# Patient Record
Sex: Male | Born: 1960 | Race: White | Hispanic: No | Marital: Married | Smoking: Never smoker
Health system: Southern US, Community
[De-identification: ages and names within clinical notes are randomized; demographics above are authoritative.]

## PROBLEM LIST (undated history)

## (undated) DIAGNOSIS — E785 Hyperlipidemia, unspecified: Secondary | ICD-10-CM

## (undated) DIAGNOSIS — N401 Enlarged prostate with lower urinary tract symptoms: Secondary | ICD-10-CM

## (undated) DIAGNOSIS — R338 Other retention of urine: Secondary | ICD-10-CM

## (undated) DIAGNOSIS — N419 Inflammatory disease of prostate, unspecified: Secondary | ICD-10-CM

## (undated) DIAGNOSIS — N529 Male erectile dysfunction, unspecified: Secondary | ICD-10-CM

## (undated) DIAGNOSIS — Z800 Family history of malignant neoplasm of digestive organs: Secondary | ICD-10-CM

## (undated) HISTORY — DX: Inflammatory disease of prostate, unspecified: N41.9

## (undated) HISTORY — DX: Family history of malignant neoplasm of digestive organs: Z80.0

## (undated) HISTORY — DX: Hyperlipidemia, unspecified: E78.5

## (undated) HISTORY — PX: ANKLE SURGERY: SHX546

## (undated) HISTORY — DX: Other retention of urine: R33.8

## (undated) HISTORY — DX: Benign prostatic hyperplasia with lower urinary tract symptoms: N40.1

## (undated) HISTORY — PX: COLONOSCOPY: SHX174

## (undated) HISTORY — DX: Male erectile dysfunction, unspecified: N52.9

## (undated) HISTORY — PX: POLYPECTOMY: SHX149

---

## 2012-08-24 HISTORY — PX: OTHER SURGICAL HISTORY: SHX169

## 2013-04-18 ENCOUNTER — Encounter: Payer: Self-pay | Admitting: Family Medicine

## 2013-04-18 ENCOUNTER — Ambulatory Visit (INDEPENDENT_AMBULATORY_CARE_PROVIDER_SITE_OTHER): Payer: BC Managed Care – PPO | Admitting: Family Medicine

## 2013-04-18 VITALS — BP 129/82 | HR 70 | Ht 72.0 in | Wt 235.0 lb

## 2013-04-18 DIAGNOSIS — Z800 Family history of malignant neoplasm of digestive organs: Secondary | ICD-10-CM

## 2013-04-18 DIAGNOSIS — Z79899 Other long term (current) drug therapy: Secondary | ICD-10-CM

## 2013-04-18 DIAGNOSIS — R339 Retention of urine, unspecified: Secondary | ICD-10-CM

## 2013-04-18 DIAGNOSIS — Z5181 Encounter for therapeutic drug level monitoring: Secondary | ICD-10-CM

## 2013-04-18 DIAGNOSIS — E785 Hyperlipidemia, unspecified: Secondary | ICD-10-CM

## 2013-04-18 DIAGNOSIS — N529 Male erectile dysfunction, unspecified: Secondary | ICD-10-CM | POA: Insufficient documentation

## 2013-04-18 DIAGNOSIS — Z1211 Encounter for screening for malignant neoplasm of colon: Secondary | ICD-10-CM

## 2013-04-18 DIAGNOSIS — N401 Enlarged prostate with lower urinary tract symptoms: Secondary | ICD-10-CM | POA: Insufficient documentation

## 2013-04-18 DIAGNOSIS — Z23 Encounter for immunization: Secondary | ICD-10-CM

## 2013-04-18 DIAGNOSIS — N138 Other obstructive and reflux uropathy: Secondary | ICD-10-CM

## 2013-04-18 DIAGNOSIS — Z298 Encounter for other specified prophylactic measures: Secondary | ICD-10-CM

## 2013-04-18 HISTORY — DX: Benign prostatic hyperplasia with lower urinary tract symptoms: N40.1

## 2013-04-18 HISTORY — DX: Male erectile dysfunction, unspecified: N52.9

## 2013-04-18 HISTORY — DX: Hyperlipidemia, unspecified: E78.5

## 2013-04-18 HISTORY — DX: Family history of malignant neoplasm of digestive organs: Z80.0

## 2013-04-18 LAB — COMPLETE METABOLIC PANEL WITH GFR
ALT: 32 U/L (ref 0–53)
CO2: 28 mEq/L (ref 19–32)
Calcium: 9.8 mg/dL (ref 8.4–10.5)
Chloride: 104 mEq/L (ref 96–112)
Creat: 0.95 mg/dL (ref 0.50–1.35)
Glucose, Bld: 94 mg/dL (ref 70–99)
Sodium: 141 mEq/L (ref 135–145)
Total Bilirubin: 0.6 mg/dL (ref 0.3–1.2)
Total Protein: 7.2 g/dL (ref 6.0–8.3)

## 2013-04-18 LAB — LIPID PANEL
Cholesterol: 189 mg/dL (ref 0–200)
HDL: 46 mg/dL (ref >39)
LDL Cholesterol: 113 mg/dL — ABNORMAL HIGH (ref 0–99)
Triglycerides: 149 mg/dL (ref <150)

## 2013-04-18 LAB — PSA: PSA: 1.61 ng/mL (ref <=4.00)

## 2013-04-18 MED ORDER — TADALAFIL 2.5 MG PO TABS: ORAL_TABLET | ORAL | Status: DC

## 2013-04-18 NOTE — Progress Notes (Signed)
CC: Raymond Craig is a 52 y.o. male is here for Establish Care   Subjective: HPI:  Former patient of mine at [facility] here to establish care  Followup BPH: Patient is taking 2.5 mg Cialis every 2-3 days he notices no trouble with erectile dysfunction as he was dealing with prior to starting this regimen he also notices that the initial improvement with nighttime urination and weak stream seems to be subsiding. He is waking 2-3 times a night to urinate he is somewhat hesitating to urinate he describes a moderate decrease in the caliber of his urinary stream all of which on a daily basis and have been worsening over the last 2-3 months. Denies dysuria nor testicular pain  Followup hyperlipidemia: Is been a year since his last cholesterol panel was performed he continues on Zocor a daily basis without right upper quadrant pain myalgias nor skin or scleral discoloration he tries to stay active and watch what he eats  Patient requesting counseling on screen for colon cancer and prostate cancer  Patient reports a family history of pancreatic cancer it is occurring his father and in at least 2 of his brothers. Patient has no abdominal complaints nor digestive complaints. Rare alcohol intake no tobacco use. Denies unintentional weight loss or gain nor polyphasia or polydipsia  Review of Systems - General ROS: negative for - chills, fever, night sweats, weight gain or weight loss Ophthalmic ROS: negative for - decreased vision Psychological ROS: negative for - anxiety or depression ENT ROS: negative for - hearing change, nasal congestion, tinnitus or allergies Hematological and Lymphatic ROS: negative for - bleeding problems, bruising or swollen lymph nodes Breast ROS: negative Respiratory ROS: no cough, shortness of breath, or wheezing Cardiovascular ROS: no chest pain or dyspnea on exertion Gastrointestinal ROS: no abdominal pain, change in bowel habits, or black or bloody stools Genito-Urinary  ROS: negative for - genital discharge, genital ulcers, incontinence or abnormal bleeding from genitals Musculoskeletal ROS: negative for - joint pain or muscle pain Neurological ROS: negative for - headaches or memory loss Dermatological ROS: negative for lumps, mole changes, rash and skin lesion changes  Past Medical History  Diagnosis Date  . BPH (benign prostatic hypertrophy) with urinary retention 04/18/2013  . Erectile dysfunction 04/18/2013  . Family history of pancreatic cancer 04/18/2013  . Hyperlipidemia 04/18/2013     Family History  Problem Relation Age of Onset  . Breast cancer Mother   . Pancreatic cancer Father   . Pancreatic cancer Paternal Uncle      History  Substance Use Topics  . Smoking status: Not on file  . Smokeless tobacco: Not on file  . Alcohol Use: Not on file     Objective: Filed Vitals:   04/18/13 0828  BP: 129/82  Pulse: 70    General: Alert and Oriented, No Acute Distress HEENT: Pupils equal, round, reactive to light. Conjunctivae clear.  Moist mucous membranes pharynx unremarkable Lungs: Clear to auscultation bilaterally, no wheezing/ronchi/rales.  Comfortable work of breathing. Good air movement. Cardiac: Regular rate and rhythm. Normal S1/S2.  No murmurs, rubs, nor gallops.  No carotid bruits Abdomen: Normal bowel sounds, soft and non tender without palpable masses. Extremities: No peripheral edema.  Strong peripheral pulses.  Mental Status: No depression, anxiety, nor agitation. Skin: Warm and dry.  Assessment & Plan: Kennet was seen today for establish care.  Diagnoses and associated orders for this visit:  Hyperlipidemia - Lipid panel - COMPLETE METABOLIC PANEL WITH GFR  Encounter for monitoring statin  therapy - Lipid panel - COMPLETE METABOLIC PANEL WITH GFR  Erectile dysfunction  BPH (benign prostatic hypertrophy) with urinary retention - Tadalafil (CIALIS) 2.5 MG TABS; One by mouth daily to treat BPH - PSA  Colon cancer  screening - Ambulatory referral to Gastroenterology  Family history of pancreatic cancer - Ambulatory referral to Genetics  Prophylactic immunotherapy - Flu Vaccine QUAD 36+ mos PF IM (Fluarix)    Hyperlipidemia: He is due for recheck of cholesterol panel, will provide a refill of Crestor based on liver enzymes as well as current LDL cholesterol status BPH: Worsening I've encouraged him to take Cialis 2.5 mg on a daily basis if not improving in 2-3 weeks let me know and we will start doxazosin versus Flomax, we discussed benefits and risks of PSA testing he would prefer PSA testing today Colon cancer screening: He is due for routine screening colonoscopy referral has been placed Strong family history of pancreatic cancer: Joint decision was made to refer to genetic counseling  Return in about 3 months (around 07/19/2013) for BPH F/U.

## 2013-04-19 ENCOUNTER — Telehealth: Payer: Self-pay | Admitting: Family Medicine

## 2013-04-19 ENCOUNTER — Encounter: Payer: Self-pay | Admitting: Gastroenterology

## 2013-04-19 DIAGNOSIS — E785 Hyperlipidemia, unspecified: Secondary | ICD-10-CM

## 2013-04-19 MED ORDER — ROSUVASTATIN CALCIUM 10 MG PO TABS: 10.0000 mg | ORAL_TABLET | Freq: Every day | ORAL | Status: DC

## 2013-04-19 NOTE — Telephone Encounter (Signed)
Sue Lush, Will you please let Mr. Salvia ("coin")  Know that his cholesterol looks much much better compared to that back in May 2013, LDL cholesterol is now well controlled at 113 with a goal of less than 130.  There is no evidence of liver inflammation.  His kidney function and fasting blood sugar look great.  PSA was not elevated which means no elevated risk of prostate cancer.  I'd encourage him to f/u with me in a few weeks if daily cialis does not help with urinary complaints, otherwise continue annual visits.

## 2013-04-19 NOTE — Telephone Encounter (Signed)
Left detailed message on patient vm to call back regarding lab results. Rhonda Cunningham,CMA

## 2013-04-20 NOTE — Addendum Note (Signed)
Addended by: Laren Boom on: 04/20/2013 01:25 PM   Modules accepted: Level of Service

## 2013-04-20 NOTE — Telephone Encounter (Signed)
Left detailed message on the phone with results

## 2013-06-06 ENCOUNTER — Ambulatory Visit (AMBULATORY_SURGERY_CENTER): Payer: Self-pay | Admitting: *Deleted

## 2013-06-06 VITALS — Ht 72.0 in | Wt 240.2 lb

## 2013-06-06 DIAGNOSIS — Z1211 Encounter for screening for malignant neoplasm of colon: Secondary | ICD-10-CM

## 2013-06-06 MED ORDER — MOVIPREP 100 G PO SOLR: ORAL | Status: DC

## 2013-06-06 NOTE — Progress Notes (Signed)
No allergies to eggs or soy. No problems with anesthesia.  

## 2013-06-07 ENCOUNTER — Encounter: Payer: Self-pay | Admitting: Gastroenterology

## 2013-06-20 ENCOUNTER — Ambulatory Visit (AMBULATORY_SURGERY_CENTER): Payer: BC Managed Care – PPO | Admitting: Gastroenterology

## 2013-06-20 ENCOUNTER — Encounter: Payer: Self-pay | Admitting: Gastroenterology

## 2013-06-20 VITALS — BP 127/77 | HR 71 | Temp 97.4°F | Resp 15 | Ht 72.0 in | Wt 240.0 lb

## 2013-06-20 DIAGNOSIS — Z1211 Encounter for screening for malignant neoplasm of colon: Secondary | ICD-10-CM

## 2013-06-20 DIAGNOSIS — D126 Benign neoplasm of colon, unspecified: Secondary | ICD-10-CM

## 2013-06-20 MED ORDER — SODIUM CHLORIDE 0.9 % IV SOLN: 500.0000 mL | INTRAVENOUS | Status: DC

## 2013-06-20 NOTE — Patient Instructions (Signed)
YOU HAD AN ENDOSCOPIC PROCEDURE TODAY AT THE [facility]: Refer to the procedure report that was given to you for any specific questions about what was found during the examination.  If the procedure report does not answer your questions, please call your gastroenterologist to clarify.  If you requested that your care partner not be given the details of your procedure findings, then the procedure report has been included in a sealed envelope for you to review at your convenience later.  YOU SHOULD EXPECT: Some feelings of bloating in the abdomen. Passage of more gas than usual.  Walking can help get rid of the air that was put into your GI tract during the procedure and reduce the bloating. If you had a lower endoscopy (such as a colonoscopy or flexible sigmoidoscopy) you may notice spotting of blood in your stool or on the toilet paper. If you underwent a bowel prep for your procedure, then you may not have a normal bowel movement for a few days.  DIET: Your first meal following the procedure should be a light meal and then it is ok to progress to your normal diet.  A half-sandwich or bowl of soup is an example of a good first meal.  Heavy or fried foods are harder to digest and may make you feel nauseous or bloated.  Likewise meals heavy in dairy and vegetables can cause extra gas to form and this can also increase the bloating.  Drink plenty of fluids but you should avoid alcoholic beverages for 24 hours.  ACTIVITY: Your care partner should take you home directly after the procedure.  You should plan to take it easy, moving slowly for the rest of the day.  You can resume normal activity the day after the procedure however you should NOT DRIVE or use heavy machinery for 24 hours (because of the sedation medicines used during the test).    SYMPTOMS TO REPORT IMMEDIATELY: A gastroenterologist can be reached at any hour.  During normal business hours, 8:30 AM to 5:00 PM Monday through Friday,  call [phone].  After hours and on weekends, please call the GI answering service at [phone] who will take a message and have the physician on call contact you.   Following lower endoscopy (colonoscopy or flexible sigmoidoscopy):  Excessive amounts of blood in the stool  Significant tenderness or worsening of abdominal pains  Swelling of the abdomen that is new, acute  Fever of 100F or higher  FOLLOW UP: If any biopsies were taken you will be contacted by phone or by letter within the next 1-3 weeks.  Call your gastroenterologist if you have not heard about the biopsies in 3 weeks.  Our staff will call the home number listed on your records the next business day following your procedure to check on you and address any questions or concerns that you may have at that time regarding the information given to you following your procedure. This is a courtesy call and so if there is no answer at the home number and we have not heard from you through the emergency physician on call, we will assume that you have returned to your regular daily activities without incident.  SIGNATURES/CONFIDENTIALITY: You and/or your care partner have signed paperwork which will be entered into your electronic medical record.  These signatures attest to the fact that that the information above on your After Visit Summary has been reviewed and is understood.  Full responsibility of the confidentiality of this   discharge information lies with you and/or your care-partner.  Polyps-handout given  Repeat colonoscopy will be determined by pathology   

## 2013-06-20 NOTE — Progress Notes (Signed)
Patient did not experience any of the following events: a burn prior to discharge; a fall within the facility; wrong site/side/patient/procedure/implant event; or a hospital transfer or hospital admission upon discharge from the facility. (G8907) Patient did not have preoperative order for IV antibiotic SSI prophylaxis. (G8918)  

## 2013-06-20 NOTE — Op Note (Signed)
[facility] [address] [place], [address]   COLONOSCOPY PROCEDURE REPORT  PATIENT: Raymond, Craig  MR#: [id number] BIRTHDATE: 1960/12/26 , 52  yrs. old GENDER: Male ENDOSCOPIST: Rachael Fee, MD REFERRED BY: Laren Boom, MD PROCEDURE DATE:  06/20/2013 PROCEDURE:   Colonoscopy with snare polypectomy First Screening Colonoscopy - Avg.  risk and is 50 yrs.  old or older Yes.  Prior Negative Screening - Now for repeat screening. N/A  History of Adenoma - Now for follow-up colonoscopy & has been > or = to 3 yrs.  N/A  Polyps Removed Today? Yes. ASA CLASS:   Class II INDICATIONS:average risk screening. MEDICATIONS: Fentanyl 75 mcg IV, Versed 6 mg IV, and These medications were titrated to patient response per physician's verbal order  DESCRIPTION OF PROCEDURE:   After the risks benefits and alternatives of the procedure were thoroughly explained, informed consent was obtained.  A digital rectal exam revealed no abnormalities of the rectum.   The LB [id number]  endoscope was introduced through the anus and advanced to the cecum, which was identified by both the appendix and ileocecal valve. No adverse events experienced.   The quality of the prep was excellent.  The instrument was then slowly withdrawn as the colon was fully examined.  COLON FINDINGS: Three polyps were found, removed and sent to pathology.  These were all sessile, located in ascending and descending segments, 3mm to 8mm across, all removed with cold snare.  The examination was otherwise normal.  Retroflexed views revealed no abnormalities. The time to cecum=2 minutes 09 seconds. Withdrawal time=11 minutes 36 seconds.  The scope was withdrawn and the procedure completed. COMPLICATIONS: There were no complications.  ENDOSCOPIC IMPRESSION: Three polyps were found, removed and sent to pathology. The examination was otherwise normal.  RECOMMENDATIONS: If the polyp(s) removed today are  proven to be adenomatous (pre-cancerous) polyps, you will need a repeat colonoscopy in 3-5 years.  Otherwise you should continue to follow colorectal cancer screening guidelines for "routine risk" patients with colonoscopy in 10 years.  You will receive a letter within 1-2 weeks with the results of your biopsy as well as final recommendations.  Please call my office if you have not received a letter after 3 weeks.   eSigned:  Rachael Fee, MD 06/20/2013 11:18 AM

## 2013-06-21 ENCOUNTER — Telehealth: Payer: Self-pay | Admitting: *Deleted

## 2013-06-21 NOTE — Telephone Encounter (Signed)
  Follow up Call-  Call back number 06/20/2013  Post procedure Call Back phone  # [phone]  Permission to leave phone message Yes     Patient questions:  Do you have a fever, pain , or abdominal swelling? no Pain Score  0 *  Have you tolerated food without any problems? yes  Have you been able to return to your normal activities? yes  Do you have any questions about your discharge instructions: Diet   no Medications  no Follow up visit  no  Do you have questions or concerns about your Care? no  Actions: * If pain score is 4 or above: No action needed, pain <4.  Staff was terrific

## 2013-06-28 ENCOUNTER — Encounter: Payer: Self-pay | Admitting: Gastroenterology

## 2013-06-30 ENCOUNTER — Encounter: Payer: Self-pay | Admitting: Family Medicine

## 2013-06-30 DIAGNOSIS — Z86010 Personal history of colonic polyps: Secondary | ICD-10-CM | POA: Insufficient documentation

## 2013-12-13 ENCOUNTER — Telehealth: Payer: Self-pay | Admitting: Family Medicine

## 2013-12-13 DIAGNOSIS — R338 Other retention of urine: Principal | ICD-10-CM

## 2013-12-13 DIAGNOSIS — N401 Enlarged prostate with lower urinary tract symptoms: Secondary | ICD-10-CM

## 2013-12-13 NOTE — Telephone Encounter (Signed)
Pt called and would like referral to a Urologist in [facility].

## 2013-12-13 NOTE — Telephone Encounter (Signed)
Raymond Craig, Referral has been placed.

## 2014-01-25 ENCOUNTER — Encounter: Payer: Self-pay | Admitting: Family Medicine

## 2014-02-21 ENCOUNTER — Other Ambulatory Visit: Payer: Self-pay | Admitting: Family Medicine

## 2014-02-26 ENCOUNTER — Encounter: Payer: Self-pay | Admitting: Family Medicine

## 2014-02-26 DIAGNOSIS — N200 Calculus of kidney: Secondary | ICD-10-CM | POA: Insufficient documentation

## 2014-04-25 ENCOUNTER — Other Ambulatory Visit: Payer: Self-pay | Admitting: Family Medicine

## 2014-07-13 ENCOUNTER — Other Ambulatory Visit: Payer: Self-pay | Admitting: Family Medicine

## 2014-11-04 ENCOUNTER — Other Ambulatory Visit: Payer: Self-pay | Admitting: Family Medicine

## 2014-11-14 ENCOUNTER — Encounter: Payer: Self-pay | Admitting: Family Medicine

## 2014-11-14 ENCOUNTER — Ambulatory Visit (INDEPENDENT_AMBULATORY_CARE_PROVIDER_SITE_OTHER): Payer: BC Managed Care – PPO | Admitting: Family Medicine

## 2014-11-14 VITALS — BP 131/90 | HR 73 | Wt 242.0 lb

## 2014-11-14 DIAGNOSIS — Z0000 Encounter for general adult medical examination without abnormal findings: Secondary | ICD-10-CM

## 2014-11-14 DIAGNOSIS — G4700 Insomnia, unspecified: Secondary | ICD-10-CM | POA: Diagnosis not present

## 2014-11-14 DIAGNOSIS — N400 Enlarged prostate without lower urinary tract symptoms: Secondary | ICD-10-CM | POA: Diagnosis not present

## 2014-11-14 MED ORDER — ZOLPIDEM TARTRATE ER 6.25 MG PO TBCR: 6.2500 mg | EXTENDED_RELEASE_TABLET | Freq: Every evening | ORAL | Status: DC | PRN

## 2014-11-14 MED ORDER — TADALAFIL 2.5 MG PO TABS: 1.0000 | ORAL_TABLET | Freq: Every day | ORAL | Status: DC

## 2014-11-14 MED ORDER — TETANUS-DIPHTH-ACELL PERTUSSIS 5-2.5-18.5 LF-MCG/0.5 IM SUSP
0.5000 mL | Freq: Once | INTRAMUSCULAR | Status: AC
  Administered 2014-11-14: 0.5 mL via INTRAMUSCULAR

## 2014-11-14 NOTE — Patient Instructions (Signed)
Dr. Sharaine Delange's General Advice Following Your Complete Physical Exam  The Benefits of Regular Exercise: Unless you suffer from an uncontrolled cardiovascular condition, studies strongly suggest that regular exercise and physical activity will add to both the quality and length of your life.  The World Health Organization recommends 150 minutes of moderate intensity aerobic activity every week.  This is best split over 3-4 days a week, and can be as simple as a brisk walk for just over 35 minutes "most days of the week".  This type of exercise has been shown to lower LDL-Cholesterol, lower average blood sugars, lower blood pressure, lower cardiovascular disease risk, improve memory, and increase one's overall sense of wellbeing.  The addition of anaerobic (or "strength training") exercises offers additional benefits including but not limited to increased metabolism, prevention of osteoporosis, and improved overall cholesterol levels.  How Can I Strive For A Low-Fat Diet?: Current guidelines recommend that 25-35 percent of your daily energy (food) intake should come from fats.  One might ask how can this be achieved without having to dissect each meal on a daily basis?  Switch to skim or 1% milk instead of whole milk.  Focus on lean meats such as ground turkey, fresh fish, baked chicken, and lean cuts of beef as your source of dietary protein.  Limit saturated fat consumption to less than 10% of your daily caloric intake.  Limit trans fatty acid consumption primarily by limiting synthetic trans fats such as partially hydrogenated oils (Ex: fried fast foods).  Substitute olive or vegetable oil for solid fats where possible.  Moderation of Salt Intake: Provided you don't carry a diagnosis of congestive heart failure nor renal failure, I recommend a daily allowance of no more than 2300 mg of salt (sodium).  Keeping under this daily goal is associated with a decreased risk of cardiovascular events, creeping  above it can lead to elevated blood pressures and increases your risk of cardiovascular events.  Milligrams (mg) of salt is listed on all nutrition labels, and your daily intake can add up faster than you think.  Most canned and frozen dinners can pack in over half your daily salt allowance in one meal.    Lifestyle Health Risks: Certain lifestyle choices carry specific health risks.  As you may already know, tobacco use has been associated with increasing one's risk of cardiovascular disease, pulmonary disease, numerous cancers, among many other issues.  What you may not know is that there are medications and nicotine replacement strategies that can more than double your chances of successfully quitting.  I would be thrilled to help manage your quitting strategy if you currently use tobacco products.  When it comes to alcohol use, I've yet to find an "ideal" daily allowance.  Provided an individual does not have a medical condition that is exacerbated by alcohol consumption, general guidelines determine "safe drinking" as no more than two standard drinks for a man or no more than one standard drink for a male per day.  However, much debate still exists on whether any amount of alcohol consumption is technically "safe".  My general advice, keep alcohol consumption to a minimum for general health promotion.  If you or others believe that alcohol, tobacco, or recreational drug use is interfering with your life, I would be happy to provide confidential counseling regarding treatment options.  General "Over The Counter" Nutrition Advice: Postmenopausal women should aim for a daily calcium intake of 1200 mg, however a significant portion of this might already be   provided by diets including milk, yogurt, cheese, and other dairy products.  Vitamin D has been shown to help preserve bone density, prevent fatigue, and has even been shown to help reduce falls in the elderly.  Ensuring a daily intake of 800 Units of  Vitamin D is a good place to start to enjoy the above benefits, we can easily check your Vitamin D level to see if you'd potentially benefit from supplementation beyond 800 Units a day.  Folic Acid intake should be of particular concern to women of childbearing age.  Daily consumption of 400-800 mcg of Folic Acid is recommended to minimize the chance of spinal cord defects in a fetus should pregnancy occur.    For many adults, accidents still remain one of the most common culprits when it comes to cause of death.  Some of the simplest but most effective preventitive habits you can adopt include regular seatbelt use, proper helmet use, securing firearms, and regularly testing your smoke and carbon monoxide detectors.  Altariq Goodall B. Deshara Rossi DO [facility] [address] [place], [place] [address] Phone: [phone]  

## 2014-11-14 NOTE — Progress Notes (Signed)
CC: Raymond Craig is a 54 y.o. male is here for Annual Exam   Subjective: HPI:  Colonoscopy: October 2014 with polyps, repeat 2017 Prostate: Discussed screening risks/beneifts with patient today, he believes that his PSA was checked late summer 2016 by his urologist and was normal he declines PSA today  Influenza Vaccine: declined Pneumovax: no indication Td/Tdap: overdue and will receive booster today Zoster: (Start 52 yo)  His only acute complaint today is difficulty staying asleep. This happens most days of the week. He is not sure exactly what contributes to it. He was once on a prescription sleep medicine in the past but he forgets the name of it. He denies any intolerance to sleep medications in the past that over-the-counter Benadryl and other sleep aids do not seem effective. He usually gets 4 hours of sleep and will wake up for no particular reason and although he still tired cannot fall back asleep. Denies anxiety or pain or urinary frequency contributing to this issue.  Review of Systems - General ROS: negative for - chills, fever, night sweats, weight gain or weight loss Ophthalmic ROS: negative for - decreased vision Psychological ROS: negative for - anxiety or depression ENT ROS: negative for - hearing change, nasal congestion, tinnitus or allergies Hematological and Lymphatic ROS: negative for - bleeding problems, bruising or swollen lymph nodes Breast ROS: negative Respiratory ROS: no cough, shortness of breath, or wheezing Cardiovascular ROS: no chest pain or dyspnea on exertion Gastrointestinal ROS: no abdominal pain, change in bowel habits, or black or bloody stools Genito-Urinary ROS: negative for - genital discharge, genital ulcers, incontinence or abnormal bleeding from genitals Musculoskeletal ROS: negative for - joint pain or muscle pain Neurological ROS: negative for - headaches or memory loss Dermatological ROS: negative for lumps, mole changes, rash and skin lesion  changes  Past Medical History  Diagnosis Date  . BPH (benign prostatic hypertrophy) with urinary retention 04/18/2013  . Erectile dysfunction 04/18/2013  . Family history of pancreatic cancer 04/18/2013  . Hyperlipidemia 04/18/2013    Past Surgical History  Procedure Laterality Date  . Lipoma removal  2014    left chest   Family History  Problem Relation Age of Onset  . Breast cancer Mother   . Pancreatic cancer Father   . Pancreatic cancer Paternal Uncle   . Colon cancer Neg Hx     History   Social History  . Marital Status: Married    Spouse Name: N/A  . Number of Children: N/A  . Years of Education: N/A   Occupational History  . Not on file.   Social History Main Topics  . Smoking status: Never Smoker   . Smokeless tobacco: Never Used  . Alcohol Use: 2.4 oz/week    4 Cans of beer per week  . Drug Use: No  . Sexual Activity: Not on file   Other Topics Concern  . Not on file   Social History Narrative     Objective: BP 131/90 mmHg  Pulse 73  Wt 242 lb (109.77 kg)  SpO2 96%  General: No Acute Distress HEENT: Atraumatic, normocephalic, conjunctivae normal without scleral icterus.  No nasal discharge, hearing grossly intact, TMs with good landmarks bilaterally with no middle ear abnormalities, posterior pharynx clear without oral lesions. Neck: Supple, trachea midline, no cervical nor supraclavicular adenopathy. Pulmonary: Clear to auscultation bilaterally without wheezing, rhonchi, nor rales. Cardiac: Regular rate and rhythm.  No murmurs, rubs, nor gallops. No peripheral edema.  2+ peripheral pulses bilaterally. Abdomen: Bowel  sounds normal.  No masses.  Non-tender without rebound.  Negative Murphy's sign. MSK: Grossly intact, no signs of weakness.  Full strength throughout upper and lower extremities.  Full ROM in upper and lower extremities.  No midline spinal tenderness. Neuro: Gait unremarkable, CN II-XII grossly intact.  C5-C6 Reflex 2/4 Bilaterally, L4  Reflex 2/4 Bilaterally.  Cerebellar function intact. Skin: No rashes. Psych: Alert and oriented to person/place/time.  Thought process normal. No anxiety/depression.  Assessment & Plan: Raymond Craig was seen today for annual exam.  Diagnoses and all orders for this visit:  Annual physical exam Orders: -     Lipid panel -     COMPLETE METABOLIC PANEL WITH GFR -     CBC  Insomnia Orders: -     zolpidem (AMBIEN CR) 6.25 MG CR tablet; Take 1 tablet (6.25 mg total) by mouth at bedtime as needed for sleep.  BPH (benign prostatic hyperplasia) Orders: -     Tadalafil (CIALIS) 2.5 MG TABS; Take 1 tablet (2.5 mg total) by mouth daily.  Healthy lifestyle interventions including but not limited to regular exercise, a healthy low fat diet, moderation of salt intake, the dangers of tobacco/alcohol/recreational drug use, nutrition supplementation, and accident avoidance were discussed with the patient and a handout was provided for future reference.  Requesting refills on Cialis   discussed risks and benefits of using AmbienAnd if ineffective can increase to the 12 mg formulation  Return for Annual follow up if labs are normal today.Marland Kitchen

## 2014-11-15 ENCOUNTER — Telehealth: Payer: Self-pay | Admitting: Family Medicine

## 2014-11-15 NOTE — Telephone Encounter (Signed)
Received fax from pharmacy to do pa on Zolpidem tart ER 6.25 mg sent through cover my meds waiting on auth. - CF

## 2014-11-20 ENCOUNTER — Telehealth: Payer: Self-pay | Admitting: *Deleted

## 2014-11-20 MED ORDER — KETOCONAZOLE 2 % EX CREA: TOPICAL_CREAM | CUTANEOUS | Status: DC

## 2014-11-20 NOTE — Telephone Encounter (Signed)
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 He states he was told to use moisturizer but it seems worse and wants a medication called in.Marland KitchenMarland KitchenI do not see anywhere in his chart where he saw you for this problem. He needs appt unless you are aware of this problem

## 2014-11-20 NOTE — Telephone Encounter (Signed)
We talked about seborrheic dermatitis on his face but this was not documented. I will send a prescription of ketoconazole to his [facility]. If this is not beneficial after 2 weeks there are other options that I can call in.

## 2014-11-20 NOTE — Telephone Encounter (Signed)
Pt.notified

## 2014-11-22 ENCOUNTER — Telehealth: Payer: Self-pay | Admitting: Family Medicine

## 2014-11-22 DIAGNOSIS — R739 Hyperglycemia, unspecified: Secondary | ICD-10-CM | POA: Insufficient documentation

## 2014-11-22 LAB — COMPLETE METABOLIC PANEL WITH GFR
ALK PHOS: 41 U/L (ref 39–117)
ALT: 36 U/L (ref 0–53)
AST: 31 U/L (ref 0–37)
Albumin: 4.3 g/dL (ref 3.5–5.2)
BILIRUBIN TOTAL: 0.6 mg/dL (ref 0.2–1.2)
BUN: 13 mg/dL (ref 6–23)
CO2: 28 mEq/L (ref 19–32)
Calcium: 9.6 mg/dL (ref 8.4–10.5)
Chloride: 104 mEq/L (ref 96–112)
Creat: 0.87 mg/dL (ref 0.50–1.35)
GFR, Est African American: >89 mL/min
Glucose, Bld: 108 mg/dL — ABNORMAL HIGH (ref 70–99)
POTASSIUM: 3.6 meq/L (ref 3.5–5.3)
Sodium: 137 mEq/L (ref 135–145)
Total Protein: 7 g/dL (ref 6.0–8.3)

## 2014-11-22 LAB — LIPID PANEL
CHOL/HDL RATIO: 3.9 ratio
CHOLESTEROL: 160 mg/dL (ref 0–200)
HDL: 41 mg/dL (ref >=40)
LDL Cholesterol: 98 mg/dL (ref 0–99)
Triglycerides: 104 mg/dL (ref <150)
VLDL: 21 mg/dL (ref 0–40)

## 2014-11-22 LAB — CBC
HCT: 47 % (ref 39.0–52.0)
Hemoglobin: 16.3 g/dL (ref 13.0–17.0)
MCH: 29.8 pg (ref 26.0–34.0)
MCHC: 34.7 g/dL (ref 30.0–36.0)
MCV: 85.9 fL (ref 78.0–100.0)
MPV: 9.4 fL (ref 8.6–12.4)
PLATELETS: 171 10*3/uL (ref 150–400)
RBC: 5.47 MIL/uL (ref 4.22–5.81)
RDW: 14.1 % (ref 11.5–15.5)
WBC: 5.1 10*3/uL (ref 4.0–10.5)

## 2014-11-22 NOTE — Telephone Encounter (Signed)
Left Vm for patient concerning lab results & pharmacy note about Ambien ER..Zeda Gangwer

## 2014-11-22 NOTE — Telephone Encounter (Signed)
Received Fax from insurance and they will not cover more than the plan allows for this medication. - CF

## 2014-11-22 NOTE — Telephone Encounter (Signed)
Will you also let him know that [facility] is not covering more than 15 capsules of Ambien ER every 30 days.  If he feels that he needs more than this would he be interested in trying the non ER formulation?

## 2014-11-22 NOTE — Telephone Encounter (Signed)
[place], Will you please let patient know that his cholesterol was well controlled, kidney function and liver function was normal, and blood cell counts were all normal.  His blood sugar was mildly elevated and I'd recommend a A1c be checked to measure his three month average blood sugar.  Lab slip in your inbox if the lab is unable to add this on.

## 2014-11-23 ENCOUNTER — Telehealth: Payer: Self-pay | Admitting: *Deleted

## 2014-11-23 MED ORDER — ZOLPIDEM TARTRATE 10 MG PO TABS: 10.0000 mg | ORAL_TABLET | Freq: Every evening | ORAL | Status: DC | PRN

## 2014-11-23 NOTE — Telephone Encounter (Signed)
Seth Bake, Rx placed in in-box ready for pickup/faxing. Immediate release formulation at a higher dose than the long acting formulation he had been using.

## 2014-11-23 NOTE — Telephone Encounter (Signed)
Pt left a message stating he is ok with trying new rx for ambien but he wanted you to know that the last dose of ambien rx;d did not work for him

## 2014-11-23 NOTE — Telephone Encounter (Signed)
Pt.notified

## 2015-02-28 ENCOUNTER — Other Ambulatory Visit: Payer: Self-pay | Admitting: Family Medicine

## 2015-03-24 ENCOUNTER — Other Ambulatory Visit: Payer: Self-pay | Admitting: Family Medicine

## 2015-07-12 ENCOUNTER — Other Ambulatory Visit: Payer: Self-pay | Admitting: Family Medicine

## 2015-10-18 ENCOUNTER — Other Ambulatory Visit: Payer: Self-pay | Admitting: Family Medicine

## 2015-10-21 NOTE — Telephone Encounter (Signed)
Due for f/u appt in March

## 2015-11-24 ENCOUNTER — Other Ambulatory Visit: Payer: Self-pay | Admitting: Family Medicine

## 2015-11-26 ENCOUNTER — Ambulatory Visit (INDEPENDENT_AMBULATORY_CARE_PROVIDER_SITE_OTHER): Payer: BC Managed Care – PPO | Admitting: Family Medicine

## 2015-11-26 ENCOUNTER — Encounter: Payer: Self-pay | Admitting: Family Medicine

## 2015-11-26 VITALS — BP 161/98 | HR 85 | Wt 250.0 lb

## 2015-11-26 DIAGNOSIS — G4700 Insomnia, unspecified: Secondary | ICD-10-CM

## 2015-11-26 DIAGNOSIS — I10 Essential (primary) hypertension: Secondary | ICD-10-CM

## 2015-11-26 DIAGNOSIS — E785 Hyperlipidemia, unspecified: Secondary | ICD-10-CM

## 2015-11-26 DIAGNOSIS — Z800 Family history of malignant neoplasm of digestive organs: Secondary | ICD-10-CM | POA: Diagnosis not present

## 2015-11-26 MED ORDER — ZOLPIDEM TARTRATE 10 MG PO TABS: 10.0000 mg | ORAL_TABLET | Freq: Every evening | ORAL | Status: DC | PRN

## 2015-11-26 NOTE — Progress Notes (Signed)
CC: Raymond Craig is a 55 y.o. male is here for Medication Refill and Hypertension   Subjective: HPI:  Follow-up insomnia: He is getting 6 hours of sleep a night when he takes Ambien. He is very happy with his response but still would like to have a few more hours of sleep. He tells me his functional morning without any known side effects. If he doesn't take this medication he cannot get any more than 1 hours of sleep a night.  Denies urinary, pain or anxiety habits interfering with sleep.  Follow-up hyperlipidemia: He decided to stop taking Crestor as he is fearful of statins.   His father used to have pancreatic cancer and patient is fearful that this could be hereditary. He denies any epigastric pain or loose stools.   no outside blood pressures reported no chest pain shortness of breath orthopnea nor peripheral edema     Review Of Systems Outlined In HPI  Past Medical History  Diagnosis Date  . BPH (benign prostatic hypertrophy) with urinary retention 04/18/2013  . Erectile dysfunction 04/18/2013  . Family history of pancreatic cancer 04/18/2013  . Hyperlipidemia 04/18/2013    Past Surgical History  Procedure Laterality Date  . Lipoma removal  2014    left chest   Family History  Problem Relation Age of Onset  . Breast cancer Mother   . Pancreatic cancer Father   . Pancreatic cancer Paternal Uncle   . Colon cancer Neg Hx     Social History   Social History  . Marital Status: Married    Spouse Name: N/A  . Number of Children: N/A  . Years of Education: N/A   Occupational History  . Not on file.   Social History Main Topics  . Smoking status: Never Smoker   . Smokeless tobacco: Never Used  . Alcohol Use: 2.4 oz/week    4 Cans of beer per week  . Drug Use: No  . Sexual Activity: Not on file   Other Topics Concern  . Not on file   Social History Narrative     Objective: BP 161/98 mmHg  Pulse 85  Wt 250 lb (113.399 kg)  General: Alert and Oriented, No Acute  Distress HEENT: Pupils equal, round, reactive to light. Conjunctivae clear.  Moist mucous membranes  Lungs: Clear to auscultation bilaterally, no wheezing/ronchi/rales.  Comfortable work of breathing. Good air movement. Cardiac: Regular rate and rhythm. Normal S1/S2.  No murmurs, rubs, nor gallops.   Extremities: No peripheral edema.  Strong peripheral pulses.  Mental Status: No depression, anxiety, nor agitation. Skin: Warm and dry.  Assessment & Plan: [place] Craig was seen today for medication refill and hypertension.  Diagnoses and all orders for this visit:  Insomnia  Hyperlipidemia -     Lipid panel  Family history of pancreatic cancer -     Lipase  Essential hypertension  Other orders -     zolpidem (AMBIEN) 10 MG tablet; Take 1 tablet (10 mg total) by mouth at bedtime as needed.  Insomnia: Controlled with Ambien discussed possibly switching to a different agent to see if it gives him more than 6 hours of sleep however joint decision to stick with Ambien since he is not having side effects   hyperlipidemia: Due for repeat lipid panel off of the statin.   family history of pancreatic cancer will screen occasionally with lipase. Discussed DASH diet to help with lowering BP   Return in about 3 months (around 02/25/2016).

## 2016-02-13 ENCOUNTER — Telehealth: Payer: Self-pay

## 2016-02-13 ENCOUNTER — Other Ambulatory Visit: Payer: Self-pay | Admitting: Family Medicine

## 2016-02-13 NOTE — Telephone Encounter (Signed)
Raymond Craig called and states he needs a PA for the Ambien for the day supply. There is a amount limit on the Ambien and he takes it daily.

## 2016-02-14 NOTE — Telephone Encounter (Signed)
Approved for 90 tablets per 75 days; 30 tablets for 25 days ,approved until 02/14/2019, next avail fill date 07/7 will be able to get the 30 then. PA [phone] Left message on patient's vm Message left on pharmacy voicemail

## 2016-03-23 ENCOUNTER — Encounter: Payer: Self-pay | Admitting: Gastroenterology

## 2016-04-15 ENCOUNTER — Telehealth: Payer: Self-pay | Admitting: *Deleted

## 2016-04-15 NOTE — Telephone Encounter (Signed)
Initiated a PA through [employer] (Key: [id number]) [id number]

## 2016-04-16 NOTE — Telephone Encounter (Signed)
The cialis 2.5 mg tablet has been approve. PA # is [phone]. Left discreet message on patients vm and called and let pharn know as well

## 2016-07-13 ENCOUNTER — Other Ambulatory Visit: Payer: Self-pay

## 2016-07-13 MED ORDER — ZOLPIDEM TARTRATE 10 MG PO TABS: 10.0000 mg | ORAL_TABLET | Freq: Every evening | ORAL | 0 refills | Status: DC | PRN

## 2016-08-10 ENCOUNTER — Ambulatory Visit: Payer: BC Managed Care – PPO | Admitting: Osteopathic Medicine

## 2016-08-12 ENCOUNTER — Ambulatory Visit (INDEPENDENT_AMBULATORY_CARE_PROVIDER_SITE_OTHER): Payer: BC Managed Care – PPO | Admitting: Osteopathic Medicine

## 2016-08-12 ENCOUNTER — Encounter: Payer: Self-pay | Admitting: Osteopathic Medicine

## 2016-08-12 VITALS — BP 143/84 | HR 74 | Ht 72.0 in | Wt 245.0 lb

## 2016-08-12 DIAGNOSIS — N529 Male erectile dysfunction, unspecified: Secondary | ICD-10-CM

## 2016-08-12 DIAGNOSIS — N401 Enlarged prostate with lower urinary tract symptoms: Secondary | ICD-10-CM

## 2016-08-12 DIAGNOSIS — R338 Other retention of urine: Secondary | ICD-10-CM

## 2016-08-12 DIAGNOSIS — J069 Acute upper respiratory infection, unspecified: Secondary | ICD-10-CM

## 2016-08-12 DIAGNOSIS — Z86010 Personal history of colonic polyps: Secondary | ICD-10-CM

## 2016-08-12 DIAGNOSIS — G4700 Insomnia, unspecified: Secondary | ICD-10-CM

## 2016-08-12 DIAGNOSIS — B9789 Other viral agents as the cause of diseases classified elsewhere: Secondary | ICD-10-CM

## 2016-08-12 DIAGNOSIS — R030 Elevated blood-pressure reading, without diagnosis of hypertension: Secondary | ICD-10-CM

## 2016-08-12 MED ORDER — TADALAFIL 2.5 MG PO TABS: 2.5000 mg | ORAL_TABLET | Freq: Every day | ORAL | 3 refills | Status: DC

## 2016-08-12 MED ORDER — ZOLPIDEM TARTRATE 10 MG PO TABS: 10.0000 mg | ORAL_TABLET | Freq: Every evening | ORAL | 5 refills | Status: DC | PRN

## 2016-08-12 NOTE — Progress Notes (Signed)
HPI: Raymond Craig is a 55 y.o. male  who presents to [facility] today, 08/12/16,  for chief complaint of:  Chief Complaint  Patient presents with  . Other    SWITCH FROM HOMMEL/ MEDICATION REFILL    Insomnia: Stable. Has been on Ambien for several years, other alternatives tried but don't seem to be helpful. Reports compliance with behavioral modifications  Erectile dysfunction: Stable on Cialis, patient is happy with low-dose daily  History of colon polyps: Patient states that he thinks he is due for a colonoscopy, last one was about 3 years ago. Requests referral. No constipation or blood in the stool.  BP: Mildly elevated, no chest pain, pressure, shortness of breath.  Viral URI: Patient states not bothering him too much today, very mild cough/sinus congestion.   Past medical, surgical, social and family history reviewed: Patient Active Problem List   Diagnosis Date Noted  . Insomnia 11/26/2015  . Hyperglycemia 11/22/2014  . Left nephrolithiasis 02/26/2014  . Personal history of colonic polyps 06/30/2013  . Hyperlipidemia 04/18/2013  . Erectile dysfunction 04/18/2013  . BPH (benign prostatic hypertrophy) with urinary retention 04/18/2013  . Family history of pancreatic cancer 04/18/2013   Past Surgical History:  Procedure Laterality Date  . lipoma removal  2014   left chest   Social History  Substance Use Topics  . Smoking status: Never Smoker  . Smokeless tobacco: Never Used  . Alcohol use 2.4 oz/week    4 Cans of beer per week   Family History  Problem Relation Age of Onset  . Breast cancer Mother   . Pancreatic cancer Father   . Pancreatic cancer Paternal Uncle   . Colon cancer Neg Hx      Current medication list and allergy/intolerance information reviewed:   Current Outpatient Prescriptions on File Prior to Visit  Medication Sig Dispense Refill  . CIALIS 2.5 MG TABS TAKE 1 TABLET (2.5 MG TOTAL) BY MOUTH DAILY. 30 tablet  4  . zolpidem (AMBIEN) 10 MG tablet Take 1 tablet (10 mg total) by mouth at bedtime as needed. 30 tablet 0  . alfuzosin (UROXATRAL) 10 MG 24 hr tablet Take 10 mg by mouth daily with breakfast.     No current facility-administered medications on file prior to visit.    No Known Allergies    Review of Systems:  Constitutional: +recent URI illness no fever/chills  HEENT: No  headache, no vision change  Cardiac: No  chest pain, No  pressure, No palpitations  Respiratory:  No  shortness of breath. +Cough  Gastrointestinal: No  abdominal pain, no change on bowel habits  Musculoskeletal: No new myalgia/arthralgia  Skin: No  Rash  Hem/Onc: No  easy bruising/bleeding, No  abnormal lumps/bumps  Neurologic: No  weakness, No  Dizziness  Psychiatric: No  concerns with depression, No  concerns with anxiety  Exam:  BP (!) 143/84   Pulse 74   Ht 6' (1.829 m)   Wt 245 lb (111.1 kg)   BMI 33.23 kg/m   Constitutional: VS see above. General Appearance: alert, well-developed, well-nourished, NAD  Eyes: Normal lids and conjunctive, non-icteric sclera  Ears, Nose, Mouth, Throat: MMM, Normal external inspection ears/nares/mouth/lips/gums.  Neck: No masses, trachea midline.   Respiratory: Normal respiratory effort. no wheeze, no rhonchi, no rales  Cardiovascular: S1/S2 normal, no murmur, no rub/gallop auscultated. RRR.   Musculoskeletal: Gait normal. Symmetric and independent movement of all extremities  Neurological: Normal balance/coordination. No tremor.  Skin: warm, dry, intact.  Psychiatric: Normal judgment/insight. Normal mood and affect. Oriented x3.     ASSESSMENT/PLAN:   Insomnia, unspecified type - Plan: zolpidem (AMBIEN) 10 MG tablet  History of colonic polyps - Plan: Ambulatory referral to Gastroenterology  Benign prostatic hyperplasia with urinary retention  Erectile dysfunction, unspecified erectile dysfunction type - Plan: Tadalafil (CIALIS) 2.5 MG TABS     Patient Instructions  Note: the following list assumes no pregnancy, normal liver & kidney function and no other drug interactions. Dr. Sheppard Coil has highlighted medications which are safe for you to use, but these may not be appropriate for everyone. Always ask a pharmacist or qualified medical provider if there are any questions!    Aches/Pains, Fever Acetaminophen (Tylenol) 500 mg tablets - take max 2 tablets (1000 mg) every 6 hours (4 times per day)  Ibuprofen (Motrin) 200 mg tablets - take max 4 tablets (800 mg) every 6 hours  Sinus Congestion Prescription Atrovent Cromolyn Nasal Spray (NasalCrom) 1 spray each nostril 3-4 times per day, max 6 imes per day Nasal Saline if desired Oxymetolazone (Afrin, others) sparing use due to rebound congestion Phenylephrine (Sudafed) 10 mg tablets every 4 hours (or the 12-hour formulation) Diphenhydramine (Benadryl) 25 mg tablets - take max 2 tablets every 4 hours  Cough & Sore Throat Prescription cough pills or syrups Dextromethorphan (Robitussin, others) - cough suppressant Guaifenesin (Robitussin, Mucinex, others) - expectorant (helps cough up mucus) (Dextromethorphan and Guaifenesin also come in a combination tablet) Lozenges w/ Benzocaine + Menthol (Cepacol) Honey - as much as you want! Teas which "coat the throat" - look for ingredients Elm Bark, Licorice Root, Marshmallow Root  Other Zinc Lozenges within 24 hours of symptoms onset - mixed evidence this shortens the duration of the common cold Don't waste your money on Vitamin C or Echinacea       Visit summary with medication list and pertinent instructions was printed for patient to review. All questions at time of visit were answered - patient instructed to contact office with any additional concerns. ER/RTC precautions were reviewed with the patient. Follow-up plan: Return in about 6 months (around 02/10/2017) for [facility] .  Note: Total time spent 25 minutes, greater  than 50% of the visit was spent face-to-face counseling and coordinating care for the following: The primary encounter diagnosis was Insomnia, unspecified type. Diagnoses of History of colonic polyps, Benign prostatic hyperplasia with urinary retention, Erectile dysfunction, unspecified erectile dysfunction type, Viral URI, and Elevated blood pressure reading were also pertinent to this visit.Marland Kitchen

## 2016-08-12 NOTE — Patient Instructions (Signed)

## 2016-08-12 NOTE — Progress Notes (Signed)
error 

## 2016-09-07 ENCOUNTER — Telehealth: Payer: Self-pay | Admitting: *Deleted

## 2016-09-07 NOTE — Telephone Encounter (Signed)
Patient's wife called stating the patient has the flu and he is in [place]. She says she wants tamiflu called into a pharmacy there or in route...  Do you usually just print tamiflu upon request?

## 2016-09-07 NOTE — Telephone Encounter (Signed)
Patient's spouse notified

## 2016-09-07 NOTE — Telephone Encounter (Signed)
Patient did not leave pharm info

## 2016-09-07 NOTE — Telephone Encounter (Signed)
Sorry, not without a confirmed positive flu test. If he/family have questions, can let them know I typically reserve tamiflu for confirmed cases of flu in at-risk patients (elderly or infants, heart disease, lung disease, other immune compromise).

## 2016-09-14 ENCOUNTER — Encounter: Payer: Self-pay | Admitting: Osteopathic Medicine

## 2016-09-14 ENCOUNTER — Ambulatory Visit (INDEPENDENT_AMBULATORY_CARE_PROVIDER_SITE_OTHER): Payer: BC Managed Care – PPO | Admitting: Osteopathic Medicine

## 2016-09-14 VITALS — BP 145/82 | HR 64 | Ht 72.0 in | Wt 239.0 lb

## 2016-09-14 DIAGNOSIS — N390 Urinary tract infection, site not specified: Secondary | ICD-10-CM | POA: Diagnosis not present

## 2016-09-14 DIAGNOSIS — R319 Hematuria, unspecified: Secondary | ICD-10-CM | POA: Diagnosis not present

## 2016-09-14 LAB — POCT URINALYSIS DIPSTICK
BILIRUBIN UA: NEGATIVE
Glucose, UA: NEGATIVE
Ketones, UA: NEGATIVE
Leukocytes, UA: NEGATIVE
NITRITE UA: NEGATIVE
PH UA: 5.5
Protein, UA: NEGATIVE
SPEC GRAV UA: 1.025
UROBILINOGEN UA: 0.2

## 2016-09-14 NOTE — Progress Notes (Signed)
HPI: Raymond Craig is a 56 y.o. male  who presents to [facility] today, 09/14/16,  for chief complaint of:  Chief Complaint  Patient presents with  . Urinary Tract Infection    UTI: previous UTI's maybe every few years or so. Recently treated for UTI w/ Cipro 10 days, he is on day 7. Experiencing some GI upset ?d/t antibiotics. No fever, no flank pain, no dysuria.    Past medical, surgical, social and family history reviewed: Patient Active Problem List   Diagnosis Date Noted  . Insomnia 11/26/2015  . Hyperglycemia 11/22/2014  . Left nephrolithiasis 02/26/2014  . History of colonic polyps 06/30/2013  . Hyperlipidemia 04/18/2013  . Erectile dysfunction 04/18/2013  . Benign prostatic hyperplasia with urinary retention 04/18/2013  . Family history of pancreatic cancer 04/18/2013   Past Surgical History:  Procedure Laterality Date  . lipoma removal  2014   left chest   Social History  Substance Use Topics  . Smoking status: Never Smoker  . Smokeless tobacco: Never Used  . Alcohol use 2.4 oz/week    4 Cans of beer per week   Family History  Problem Relation Age of Onset  . Breast cancer Mother   . Pancreatic cancer Father   . Pancreatic cancer Paternal Uncle   . Colon cancer Neg Hx      Current medication list and allergy/intolerance information reviewed:   Current Outpatient Prescriptions on File Prior to Visit  Medication Sig Dispense Refill  . Tadalafil (CIALIS) 2.5 MG TABS Take 1 tablet (2.5 mg total) by mouth daily. 90 tablet 3  . zolpidem (AMBIEN) 10 MG tablet Take 1 tablet (10 mg total) by mouth at bedtime as needed. 30 tablet 5   No current facility-administered medications on file prior to visit.    No Known Allergies    Review of Systems:  Constitutional: +recent illness  HEENT: No  headache, no vision change  Cardiac: No  chest pain  Respiratory:  No  shortness of breath. No  Cough  Gastrointestinal: No  abdominal  pain  Musculoskeletal: No new myalgia/arthralgia  Neurologic: No  weakness, No  Dizziness  Exam:  BP (!) 145/82   Pulse 64   Ht 6' (1.829 m)   Wt 239 lb (108.4 kg)   BMI 32.41 kg/m   Constitutional: VS see above. General Appearance: alert, well-developed, well-nourished, NAD  Eyes: Normal lids and conjunctive, non-icteric sclera  Ears, Nose, Mouth, Throat: MMM, Normal external inspection ears/nares/mouth/lips/gums.  Neck: No masses, trachea midline.   Respiratory: Normal respiratory effort. no wheeze, no rhonchi, no rales  Cardiovascular: S1/S2 normal, no murmur, no rub/gallop auscultated. RRR.   Musculoskeletal: Gait normal. Symmetric and independent movement of all extremities.   Neurological: Normal balance/coordination. No tremor.  Skin: warm, dry, intact.   Psychiatric: Normal judgment/insight. Normal mood and affect. Oriented x3.    Results for orders placed or performed in visit on 09/14/16 (from the past 72 hour(s))  POCT Urinalysis Dipstick     Status: None   Collection Time: 09/14/16  4:25 PM  Result Value Ref Range   Color, UA YELLOW    Clarity, UA CLEAR    Glucose, UA NEGATIVE    Bilirubin, UA NEGATIVE    Ketones, UA NEGATIVE    Spec Grav, UA 1.025    Blood, UA MODERATE    pH, UA 5.5    Protein, UA NEGATIVE    Urobilinogen, UA 0.2    Nitrite, UA NEGATIVE  Leukocytes, UA Negative Negative  Urine Microscopic     Status: None   Collection Time: 09/14/16  4:25 PM  Result Value Ref Range   WBC, UA 0-5 <=5 WBC/HPF   RBC / HPF 0-2 <=2 RBC/HPF   Squamous Epithelial / LPF 0-5 <=5 HPF   Bacteria, UA NONE SEEN NONE SEEN HPF   Crystals NONE SEEN NONE SEEN HPF   Casts NONE SEEN NONE SEEN LPF   Yeast NONE SEEN NONE SEEN HPF    No results found.   ASSESSMENT/PLAN: If was still having dysirua/UTI type symptoms would consider Ct w/wo to eval anatomy and stones, as it stands would defer to urology re: further workup unless worse or change, GI discomfort  likely d/t abx use.   Complicated UTI (urinary tract infection) - re-culture pending.  - Plan: POCT Urinalysis Dipstick, Urine Microscopic, Urine culture, CANCELED: Urine culture  Hematuria, unspecified type - no RBC on micro - would address with urology, culture pending    Patient Instructions  Plan:  Will send urine for labs for further evaluation  I'd schedule a follow-up with your urologist since you've been having multiple UTI. They may discuss further imaging or other tests to evaluate for cause of recurrent UTI. They may also discuss risks versus benefits of just waiting and seeing how you do, but I suspect that with multiple UTI over the past few years they may want to do a workup.   If you have another infection prior to seeing urology, we may need to get a CT scan to look for abnormal urinary anatomy or stones.     Visit summary with medication list and pertinent instructions was printed for patient to review. All questions at time of visit were answered - patient instructed to contact office with any additional concerns. ER/RTC precautions were reviewed with the patient. Follow-up plan: Return if symptoms worsen or fail to improve.

## 2016-09-14 NOTE — Patient Instructions (Signed)
Plan:  Will send urine for labs for further evaluation  I'd schedule a follow-up with your urologist since you've been having multiple UTI. They may discuss further imaging or other tests to evaluate for cause of recurrent UTI. They may also discuss risks versus benefits of just waiting and seeing how you do, but I suspect that with multiple UTI over the past few years they may want to do a workup.   If you have another infection prior to seeing urology, we may need to get a CT scan to look for abnormal urinary anatomy or stones.

## 2016-09-15 DIAGNOSIS — N390 Urinary tract infection, site not specified: Secondary | ICD-10-CM | POA: Insufficient documentation

## 2016-09-15 LAB — URINALYSIS, MICROSCOPIC ONLY
Bacteria, UA: NONE SEEN [HPF]
CRYSTALS: NONE SEEN [HPF]
Casts: NONE SEEN [LPF]
YEAST: NONE SEEN [HPF]

## 2016-09-16 LAB — URINE CULTURE: Organism ID, Bacteria: NO GROWTH

## 2016-09-16 NOTE — Telephone Encounter (Signed)
Patient called and gave information and address for the medical center he was at when sick and out of town. [facility], [place], [place]. Pt adv dr. Needed this information in order to get labs and records. Thanks

## 2016-12-07 ENCOUNTER — Encounter: Payer: Self-pay | Admitting: Gastroenterology

## 2017-01-15 ENCOUNTER — Ambulatory Visit (AMBULATORY_SURGERY_CENTER): Payer: Self-pay | Admitting: *Deleted

## 2017-01-15 ENCOUNTER — Encounter: Payer: Self-pay | Admitting: Gastroenterology

## 2017-01-15 VITALS — Ht 72.0 in | Wt 243.0 lb

## 2017-01-15 DIAGNOSIS — Z86010 Personal history of colonic polyps: Secondary | ICD-10-CM

## 2017-01-15 MED ORDER — NA SULFATE-K SULFATE-MG SULF 17.5-3.13-1.6 GM/177ML PO SOLN: 1.0000 | Freq: Once | ORAL | 0 refills | Status: AC

## 2017-01-15 NOTE — Progress Notes (Signed)
No egg or soy allergy known to patient  No issues with past sedation with any surgeries  or procedures, no intubation problems  No diet pills per patient No home 02 use per patient  No blood thinners per patient  Pt denies issues with constipation  No A fib or A flutter  EMMI video declined    

## 2017-01-26 ENCOUNTER — Ambulatory Visit (AMBULATORY_SURGERY_CENTER): Payer: BC Managed Care – PPO | Admitting: Gastroenterology

## 2017-01-26 ENCOUNTER — Encounter: Payer: Self-pay | Admitting: Gastroenterology

## 2017-01-26 VITALS — BP 124/83 | HR 58 | Temp 98.6°F | Resp 12 | Ht 72.0 in | Wt 243.0 lb

## 2017-01-26 DIAGNOSIS — D126 Benign neoplasm of colon, unspecified: Secondary | ICD-10-CM

## 2017-01-26 DIAGNOSIS — D123 Benign neoplasm of transverse colon: Secondary | ICD-10-CM

## 2017-01-26 DIAGNOSIS — Z860100 Personal history of colon polyps, unspecified: Secondary | ICD-10-CM

## 2017-01-26 DIAGNOSIS — D125 Benign neoplasm of sigmoid colon: Secondary | ICD-10-CM

## 2017-01-26 DIAGNOSIS — D124 Benign neoplasm of descending colon: Secondary | ICD-10-CM

## 2017-01-26 DIAGNOSIS — K635 Polyp of colon: Secondary | ICD-10-CM | POA: Diagnosis not present

## 2017-01-26 MED ORDER — SODIUM CHLORIDE 0.9 % IV SOLN: 500.0000 mL | INTRAVENOUS | Status: DC

## 2017-01-26 NOTE — Op Note (Signed)
[place] Patient Name: Raymond Craig Procedure Date: 01/26/2017 10:09 AM MRN: [id number] Endoscopist: Milus Banister , MD Age: 56 Referring MD:  Date of Birth: 04/01/1961 Gender: Male Account #: [id number] Procedure:                Colonoscopy Indications:              High risk colon cancer surveillance: Personal                            history of colonic polyps: Colonoscopy 2014 three                            subCM polyps (1 adenoma, 2 SSAs) Medicines:                Monitored Anesthesia Care Procedure:                Pre-Anesthesia Assessment:                           - Prior to the procedure, a History and Physical                            was performed, and patient medications and                            allergies were reviewed. The patient's tolerance of                            previous anesthesia was also reviewed. The risks                            and benefits of the procedure and the sedation                            options and risks were discussed with the patient.                            All questions were answered, and informed consent                            was obtained. Prior Anticoagulants: The patient has                            taken no previous anticoagulant or antiplatelet                            agents. ASA Grade Assessment: II - A patient with                            mild systemic disease. After reviewing the risks                            and benefits, the patient was deemed in  satisfactory condition to undergo the procedure.                           After obtaining informed consent, the colonoscope                            was passed under direct vision. Throughout the                            procedure, the patient's blood pressure, pulse, and                            oxygen saturations were monitored continuously. The                            Colonoscope was introduced through  the anus and                            advanced to the the cecum, identified by                            appendiceal orifice and ileocecal valve. The                            colonoscopy was performed without difficulty. The                            patient tolerated the procedure well. The quality                            of the bowel preparation was excellent. The                            ileocecal valve, appendiceal orifice, and rectum                            were photographed. Scope In: 10:14:53 AM Scope Out: 10:28:35 AM Scope Withdrawal Time: 0 hours 10 minutes 55 seconds  Total Procedure Duration: 0 hours 13 minutes 42 seconds  Findings:                 Five sessile polyps were found in the sigmoid                            colon, descending colon and transverse colon. The                            polyps were 2 to 4 mm in size. These polyps were                            removed with a cold snare. Resection and retrieval                            were complete.  The exam was otherwise without abnormality on                            direct and retroflexion views. Complications:            No immediate complications. Estimated blood loss:                            None. Estimated Blood Loss:     Estimated blood loss: none. Impression:               - Five 2 to 4 mm polyps in the sigmoid colon, in                            the descending colon and in the transverse colon,                            removed with a cold snare. Resected and retrieved.                           - The examination was otherwise normal on direct                            and retroflexion views. Recommendation:           - Patient has a contact number available for                            emergencies. The signs and symptoms of potential                            delayed complications were discussed with the                            patient. Return to  normal activities tomorrow.                            Written discharge instructions were provided to the                            patient.                           - Resume previous diet.                           - Continue present medications.                           You will receive a letter within 2-3 weeks with the                            pathology results and my final recommendations.                           If the polyp(s) is proven to be '  pre-cancerous' on                            pathology, you will need repeat colonoscopy in 3-5                            years. Milus Banister, MD 01/26/2017 10:32:23 AM This report has been signed electronically.

## 2017-01-26 NOTE — Progress Notes (Signed)
Called to room to assist during endoscopic procedure.  Patient ID and intended procedure confirmed with present staff. Received instructions for my participation in the procedure from the performing physician.  

## 2017-01-26 NOTE — Progress Notes (Signed)
A and O x3. Report to RN. Tolerated MAC anesthesia well.

## 2017-01-26 NOTE — Progress Notes (Signed)
Pt's states no medical or surgical changes since previsit or office visit. 

## 2017-01-26 NOTE — Patient Instructions (Signed)
  HANDOUT GIVEN: POLYPS.   YOU HAD AN ENDOSCOPIC PROCEDURE TODAY AT [facility]:   Refer to the procedure report that was given to you for any specific questions about what was found during the examination.  If the procedure report does not answer your questions, please call your gastroenterologist to clarify.  If you requested that your care partner not be given the details of your procedure findings, then the procedure report has been included in a sealed envelope for you to review at your convenience later.  YOU SHOULD EXPECT: Some feelings of bloating in the abdomen. Passage of more gas than usual.  Walking can help get rid of the air that was put into your GI tract during the procedure and reduce the bloating. If you had a lower endoscopy (such as a colonoscopy or flexible sigmoidoscopy) you may notice spotting of blood in your stool or on the toilet paper. If you underwent a bowel prep for your procedure, you may not have a normal bowel movement for a few days.  Please Note:  You might notice some irritation and congestion in your nose or some drainage.  This is from the oxygen used during your procedure.  There is no need for concern and it should clear up in a day or so.  SYMPTOMS TO REPORT IMMEDIATELY:   Following lower endoscopy (colonoscopy or flexible sigmoidoscopy):  Excessive amounts of blood in the stool  Significant tenderness or worsening of abdominal pains  Swelling of the abdomen that is new, acute  Fever of 100F or higher   For urgent or emergent issues, a gastroenterologist can be reached at any hour by calling [phone].   DIET:  We do recommend a small meal at first, but then you may proceed to your regular diet.  Drink plenty of fluids but you should avoid alcoholic beverages for 24 hours.  ACTIVITY:  You should plan to take it easy for the rest of today and you should NOT DRIVE or use heavy machinery until tomorrow (because of the sedation  medicines used during the test).    FOLLOW UP: Our staff will call the number listed on your records the next business day following your procedure to check on you and address any questions or concerns that you may have regarding the information given to you following your procedure. If we do not reach you, we will leave a message.  However, if you are feeling well and you are not experiencing any problems, there is no need to return our call.  We will assume that you have returned to your regular daily activities without incident.  If any biopsies were taken you will be contacted by phone or by letter within the next 1-3 weeks.  Please call us at [phone] if you have not heard about the biopsies in 3 weeks.    SIGNATURES/CONFIDENTIALITY: You and/or your care partner have signed paperwork which will be entered into your electronic medical record.  These signatures attest to the fact that that the information above on your After Visit Summary has been reviewed and is understood.  Full responsibility of the confidentiality of this discharge information lies with you and/or your care-partner.

## 2017-01-27 ENCOUNTER — Telehealth: Payer: Self-pay | Admitting: *Deleted

## 2017-01-27 NOTE — Telephone Encounter (Signed)
Message left on f/u call °

## 2017-01-27 NOTE — Telephone Encounter (Signed)
Message left

## 2017-02-02 ENCOUNTER — Encounter: Payer: Self-pay | Admitting: Gastroenterology

## 2017-02-12 ENCOUNTER — Other Ambulatory Visit: Payer: Self-pay | Admitting: Osteopathic Medicine

## 2017-02-12 DIAGNOSIS — G4700 Insomnia, unspecified: Secondary | ICD-10-CM

## 2017-08-19 ENCOUNTER — Other Ambulatory Visit: Payer: Self-pay | Admitting: Osteopathic Medicine

## 2017-08-19 DIAGNOSIS — G4700 Insomnia, unspecified: Secondary | ICD-10-CM

## 2017-08-20 ENCOUNTER — Ambulatory Visit (INDEPENDENT_AMBULATORY_CARE_PROVIDER_SITE_OTHER): Payer: BC Managed Care – PPO | Admitting: Physician Assistant

## 2017-08-20 ENCOUNTER — Encounter: Payer: Self-pay | Admitting: Physician Assistant

## 2017-08-20 VITALS — BP 131/75 | HR 61 | Temp 98.3°F | Resp 16 | Wt 253.3 lb

## 2017-08-20 DIAGNOSIS — G4700 Insomnia, unspecified: Secondary | ICD-10-CM | POA: Diagnosis not present

## 2017-08-20 DIAGNOSIS — K146 Glossodynia: Secondary | ICD-10-CM | POA: Diagnosis not present

## 2017-08-20 DIAGNOSIS — R432 Parageusia: Secondary | ICD-10-CM | POA: Diagnosis not present

## 2017-08-20 MED ORDER — ZOLPIDEM TARTRATE 10 MG PO TABS: 10.0000 mg | ORAL_TABLET | Freq: Every evening | ORAL | 0 refills | Status: DC | PRN

## 2017-08-20 MED ORDER — LIDOCAINE VISCOUS 2 % MT SOLN
10.0000 mL | OROMUCOSAL | 0 refills | Status: DC | PRN
Start: 2017-08-20 — End: 2017-12-29

## 2017-08-20 NOTE — Patient Instructions (Addendum)
-   can try Biotene oral rinse for dry mouth/altered taste - viscous lidocaine- swish and spit every 3 hours as needed for pain - suck on ice chips/popsicles - modify diet - avoid foods that are spicy or require a lot of chewing - eat soft foods - rinse mouth 5-6 times daily - use a soft tooth brush

## 2017-08-20 NOTE — Progress Notes (Signed)
HPI:                                                                Raymond Craig is a 56 y.o. male who presents to [facility] today for tongue pain  Patient reports painful tongue gradually worsening x 2 months. Pain involves the entire surface of the tongue. Describes pain as stinging, persistent, 8/10 in severity. He has tried a tongue guard, which he states made it worse. He also noted altered taste, stating everything now tastes sour approximately 2 weeks ago. Denies fever, malaise, sore throat, dysphagia, oral lesions, bleeding gums, or dental pain. Denies history of using tobacco products.  Also requesting refill of Ambien for insomnia.  Past Medical History:  Diagnosis Date  . BPH (benign prostatic hypertrophy) with urinary retention 04/18/2013  . Erectile dysfunction 04/18/2013  . Family history of pancreatic cancer 04/18/2013  . Hyperlipidemia 04/18/2013  . Prostatitis    Past Surgical History:  Procedure Laterality Date  . COLONOSCOPY    . lipoma removal  2014   left chest   Social History   Tobacco Use  . Smoking status: Never Smoker  . Smokeless tobacco: Never Used  Substance Use Topics  . Alcohol use: Yes    Alcohol/week: 2.4 oz    Types: 4 Cans of beer per week   family history includes Breast cancer in his mother; Pancreatic cancer in his father and paternal uncle.  ROS: negative except as noted in the HPI  Medications: Current Outpatient Medications  Medication Sig Dispense Refill  . Tadalafil (CIALIS) 2.5 MG TABS Take 1 tablet (2.5 mg total) by mouth daily. 90 tablet 3  . zolpidem (AMBIEN) 10 MG tablet TAKE 1 TABLET BY MOUTH AT BEDTIME AS NEEDED 30 tablet 5   Current Facility-Administered Medications  Medication Dose Route Frequency Provider Last Rate Last Dose  . 0.9 %  sodium chloride infusion  500 mL Intravenous Continuous Milus Banister, MD       No Known Allergies     Objective:  BP 131/75 (BP  Location: Right Arm, Patient Position: Sitting, Cuff Size: Large)   Pulse 61   Temp 98.3 F (36.8 C) (Oral)   Resp 16   Wt 253 lb 4.8 oz (114.9 kg)   SpO2 96%   BMI 34.35 kg/m  Physical Exam  Constitutional: Vital signs are normal. He is active. He does not appear ill.  HENT:  Mouth/Throat: Uvula is midline, oropharynx is clear and moist and mucous membranes are normal. No oral lesions. Dental caries (fillings) present. No oropharyngeal exudate. Tonsils are 1+ on the right. Tonsils are 1+ on the left.    Neck: Trachea normal and phonation normal.  Lymphadenopathy:       Head (right side): No submental, no submandibular and no tonsillar adenopathy present.       Head (left side): No submental, no submandibular and no tonsillar adenopathy present.    He has no cervical adenopathy.  Neurological: He is alert.     Depression screen [facility] 2/9 08/20/2017  Decreased Interest 0  Down, Depressed, Hopeless 0  PHQ - 2 Score 0  Altered sleeping 2  Tired, decreased energy 1  Change in appetite 0  Feeling bad or failure  about yourself  0  Trouble concentrating 0  Moving slowly or fidgety/restless 0  Suicidal thoughts 0  PHQ-9 Score 3  Difficult doing work/chores Somewhat difficult     No results found for this or any previous visit (from the past 72 hour(s)). No results found.    Assessment and Plan: 56 y.o. male with   1. Painful tongue - no evidence of infection/stomatitis. Symptomatic management with viscous lidocaine and dietary modification - Ambulatory referral to ENT - lidocaine (XYLOCAINE) 2 % solution; Use as directed 10 mLs in the mouth or throat as needed for mouth pain.  Dispense: 100 mL; Refill: 0  2. Altered taste - trial biotene dry mouth rinse - Ambulatory referral to ENT  3. Insomnia, unspecified type - instructed to follow-up with PCP. Short-supply provided - zolpidem (AMBIEN) 10 MG tablet; Take 1 tablet (10 mg total) by mouth at bedtime as needed.   Dispense: 30 tablet; Refill: 0   Patient education and anticipatory guidance given Patient agrees with treatment plan Follow-up as needed if symptoms worsen or fail to improve  Darlyne Russian PA-C

## 2017-08-23 NOTE — Telephone Encounter (Signed)
ambien refill addressed

## 2017-09-14 ENCOUNTER — Ambulatory Visit: Payer: BC Managed Care – PPO | Admitting: Osteopathic Medicine

## 2017-09-14 ENCOUNTER — Encounter: Payer: Self-pay | Admitting: Osteopathic Medicine

## 2017-09-14 VITALS — BP 112/70 | HR 93 | Temp 98.1°F | Wt 244.0 lb

## 2017-09-14 DIAGNOSIS — Z86010 Personal history of colonic polyps: Secondary | ICD-10-CM | POA: Diagnosis not present

## 2017-09-14 DIAGNOSIS — Z0000 Encounter for general adult medical examination without abnormal findings: Secondary | ICD-10-CM | POA: Diagnosis not present

## 2017-09-14 DIAGNOSIS — G4700 Insomnia, unspecified: Secondary | ICD-10-CM | POA: Diagnosis not present

## 2017-09-14 DIAGNOSIS — Z23 Encounter for immunization: Secondary | ICD-10-CM

## 2017-09-14 MED ORDER — ZOLPIDEM TARTRATE 10 MG PO TABS: 5.0000 mg | ORAL_TABLET | Freq: Every evening | ORAL | 1 refills | Status: DC | PRN

## 2017-09-14 MED ORDER — ZOSTER VAC RECOMB ADJUVANTED 50 MCG/0.5ML IM SUSR
0.5000 mL | Freq: Once | INTRAMUSCULAR | 1 refills | Status: AC
Start: 2017-09-14 — End: 2017-09-14

## 2017-09-14 NOTE — Progress Notes (Signed)
HPI: Raymond Craig is a 57 y.o. male  who presents to [facility] today, 09/14/17,  for chief complaint of: Check-up, refills     Patient here for annual physical / wellness exam.  See preventive care reviewed as below.  Recent labs reviewed in detail with the patient.    Brief review chronic issues:    Insomnia:  Stable. Has been on Ambien for several years, other alternatives tried but don't seem to be helpful. Reports compliance with behavioral modifications  GU:  Stable on Cialis, patient is happy with low-dose daily. Following with Urology for PSA and BPH.   History of colon polyps:  Colonoscopy 01/26/17.Marland Kitchen No constipation or blood in the stool.  Preventive care:  Has been sometime since routine labs. Last cholesterol on file 2016.      Past medical, surgical, social and family history reviewed: Patient Active Problem List   Diagnosis Date Noted  . Complicated UTI (urinary tract infection) 09/15/2016  . Insomnia 11/26/2015  . Hyperglycemia 11/22/2014  . Left nephrolithiasis 02/26/2014  . History of colonic polyps 06/30/2013  . Hyperlipidemia 04/18/2013  . Erectile dysfunction 04/18/2013  . Benign prostatic hyperplasia with urinary retention 04/18/2013  . Family history of pancreatic cancer 04/18/2013   Past Surgical History:  Procedure Laterality Date  . COLONOSCOPY    . lipoma removal  2014   left chest   Social History   Tobacco Use  . Smoking status: Never Smoker  . Smokeless tobacco: Never Used  Substance Use Topics  . Alcohol use: Yes    Alcohol/week: 2.4 oz    Types: 4 Cans of beer per week   Family History  Problem Relation Age of Onset  . Breast cancer Mother   . Pancreatic cancer Father   . Pancreatic cancer Paternal Uncle   . Colon cancer Neg Hx   . Colon polyps Neg Hx   . Esophageal cancer Neg Hx   . Rectal cancer Neg Hx   . Stomach cancer Neg Hx      Current medication list and allergy/intolerance  information reviewed:   Current Outpatient Medications on File Prior to Visit  Medication Sig Dispense Refill  . lidocaine (XYLOCAINE) 2 % solution Use as directed 10 mLs in the mouth or throat as needed for mouth pain. 100 mL 0  . Tadalafil (CIALIS) 2.5 MG TABS Take 1 tablet (2.5 mg total) by mouth daily. 90 tablet 3  . zolpidem (AMBIEN) 10 MG tablet Take 1 tablet (10 mg total) by mouth at bedtime as needed. 30 tablet 0   Current Facility-Administered Medications on File Prior to Visit  Medication Dose Route Frequency Provider Last Rate Last Dose  . 0.9 %  sodium chloride infusion  500 mL Intravenous Continuous Milus Banister, MD       No Known Allergies    Review of Systems:  Constitutional: No fever/chills or recent illness   HEENT: No  headache, no vision change  Cardiac: No  chest pain, No  pressure, No palpitations  Respiratory:  No  shortness of breath. +Cough  Gastrointestinal: No  abdominal pain, no change on bowel habits  Musculoskeletal: No new myalgia/arthralgia  Skin: No  Rash  Hem/Onc: No  easy bruising/bleeding, No  abnormal lumps/bumps  Neurologic: No  weakness, No  Dizziness  Psychiatric: No  concerns with depression, No  concerns with anxiety  Exam:  BP 112/70   Pulse 93   Temp 98.1 F (36.7 C) (Oral)   Wt  244 lb (110.7 kg)   BMI 33.09 kg/m   Constitutional: VS see above. General Appearance: alert, well-developed, well-nourished, NAD  Eyes: Normal lids and conjunctive, non-icteric sclera  Ears, Nose, Mouth, Throat: MMM, Normal external inspection ears/nares/mouth/lips/gums.  Neck: No masses, trachea midline.   Respiratory: Normal respiratory effort. no wheeze, no rhonchi, no rales  Cardiovascular: S1/S2 normal, no murmur, no rub/gallop auscultated. RRR.   Musculoskeletal: Gait normal. Symmetric and independent movement of all extremities  Neurological: Normal balance/coordination. No tremor.  Skin: warm, dry, intact.   Psychiatric:  Normal judgment/insight. Normal mood and affect. Oriented x3.     ASSESSMENT/PLAN:   Annual physical exam - Plan: CBC, COMPLETE METABOLIC PANEL WITH GFR, Lipid panel  Need for influenza vaccination - Plan: Flu Vaccine QUAD 6+ mos PF IM (Fluarix Quad PF)  Insomnia, unspecified type - Plan: zolpidem (AMBIEN) 10 MG tablet  Need for shingles vaccine - Plan: Varicella-zoster vaccine IM (Shingrix)    MALE PREVENTIVE CARE  updated 09/14/17  ANNUAL SCREENING/COUNSELING  Any changes to health in the past year? no  Diet/Exercise - HEALTHY HABITS DISCUSSED TO DECREASE CV RISK Social History   Tobacco Use  Smoking Status Never Smoker  Smokeless Tobacco Never Used   Social History   Substance and Sexual Activity  Alcohol Use Yes  . Alcohol/week: 2.4 oz  . Types: 4 Cans of beer per week   Depression screen [facility] 2/9 08/20/2017  Decreased Interest 0  Down, Depressed, Hopeless 0  PHQ - 2 Score 0  Altered sleeping 2  Tired, decreased energy 1  Change in appetite 0  Feeling bad or failure about yourself  0  Trouble concentrating 0  Moving slowly or fidgety/restless 0  Suicidal thoughts 0  PHQ-9 Score 3  Difficult doing work/chores Somewhat difficult    INFECTIOUS DISEASE SCREENING  HIV - does ont need  GC/CT - does not need  HepC - does not need  TB - does not need  CANCER SCREENING  Lung - USPSTF: 55-80yo w/ 30 py hx unless quit w/in 79yr - does not need  Colon - does not need - following with GI   Prostate - does not need - following with Urology  OTHER DISEASE SCREENING  Lipid - needs  DM2 - needs  AAA - 65-75yo ever smoked: does not need  Osteoporosis - men 55yo+ - does not need   ADULT VACCINATION  Influenza - annual vaccine recommended  Td - booster every 10 years   Zoster - Shingrix recommended 39+ years old  PCV13 - was not indicated  PPSV23 - was not indicated Immunization History  Administered Date(s) Administered  .  Influenza,inj,Quad PF,6+ Mos 04/18/2013, 09/14/2017  . Influenza-Unspecified 05/08/2016  . Tdap 11/14/2014  . Zoster Recombinat (Shingrix) 09/14/2017      Visit summary with medication list and pertinent instructions was printed for patient to review. All questions at time of visit were answered - patient instructed to contact office with any additional concerns. ER/RTC precautions were reviewed with the patient. Follow-up plan: Return in about 1 year (around 09/14/2018) for [place], sooner if needed . OK to refill Ambien in 6 mos (02/2018) or until next year (08/2018)

## 2017-09-14 NOTE — Patient Instructions (Signed)
Live Zoster (Shingles) Vaccine, ZVL: What You Need to Know  1. What is shingles?  Shingles (also called herpes zoster, or just zoster) is a painful skin rash, often with blisters. Shingles is caused by the varicella zoster virus, the same virus that causes chickenpox. After you have chickenpox, the virus stays in your body and can cause shingles later in life.  You can't catch shingles from another person. However, a person who has never had chickenpox (or chickenpox vaccine) could get chickenpox from someone with shingles.  A shingles rash usually appears on one side of the face or body and heals within 2 to 4 weeks. Its main symptom is pain, which can be severe. Other symptoms can include fever, headache, chills, and upset stomach. Very rarely, a shingles infection can lead to pneumonia, hearing problems, blindness, brain inflammation (encephalitis), or death.  For about 1 person in 5, severe pain can continue even long after the rash has cleared up. This long-lasting pain is called post-herpetic neuralgia (PHN).  Shingles is far more common in people 50 years of age and older than in younger people, and the risk increases with age. It is also more common in people whose immune system is weakened because of a disease such as cancer or by drugs such as steroids or chemotherapy.  At least 1 million people a year in the [place] get shingles.  2. Shingles vaccine (live)  A live shingles vaccine was approved by FDA in 2006. In a clinical trial, the vaccine reduced the risk of shingles by about 50% in people 60 and older. It can reduce the likelihood of PHN, and reduce pain in some people who still get shingles after being vaccinated.  The recommended schedule for live shingles vaccine is a single dose for adults 60 years of age and older.  3. Some people should not get this vaccine  Tell your vaccine provider if you:  · Have any severe, life-threatening allergies. A person who has ever had a life-threatening  allergic reaction after a dose of live shingles vaccine, or has a severe allergy to any component of this vaccine, may be advised not to be vaccinated. Ask your health care provider if you want information about vaccine components.  · Are pregnant, or think you might be pregnant. Pregnant women should wait to get live shingles vaccine until they are no longer pregnant. Women should avoid getting pregnant for at least 1 month after getting shingles vaccine.  · Have a weakened immune system due to disease (such as cancer or AIDS) or medical treatments (such as radiation, immunotherapy, high-dose steroids, or chemotherapy).  · Are not feeling well. If you have a mild illness, such as a cold, you can probably get the vaccine today. If you are moderately or severely ill, you should probably wait until you recover. Your doctor can advise you.    4. Risks of a vaccine reaction  With any medicine, including vaccines, there is a chance of reactions.  After live shingles vaccination, a person might experience:  · Redness, soreness, swelling, or itching at the site of the injection  · Headache    These events are usually mild and go away on their own.  Rarely, live shingles vaccine can cause rash or shingles.  Other things that could happen after this vaccine:  · People sometimes faint after medical procedures, including vaccination. Sitting or lying down for about 15 minutes can help prevent fainting and injuries caused by a fall. Tell your   provider if you feel dizzy or have vision changes or ringing in the ears.  · Some people get shoulder pain that can be more severe and longer-lasting than routine soreness that can follow injections. This happens very rarely.  · Any medication can cause a severe allergic reaction. Such reactions to a vaccine are estimated at about 1 in a million doses, and would happen within a few minutes to a few hours after the vaccination.  As with any medicine, there is a very remote chance of a  vaccine causing a serious injury or death.  The safety of vaccines is always being monitored. For more information, visit: www.cdc.gov/vaccinesafety/  5. What if there is a serious problem?  What should I look for?  · Look for anything that concerns you, such as signs of a severe allergic reaction, very high fever, or unusual behavior.  Signs of a severe allergic reaction can include hives, swelling of the face and throat, difficulty breathing, a fast heartbeat, dizziness, and weakness. These would usually start a few minutes to a few hours after the vaccination.  What should I do?  · If you think it is a severe allergic reaction or other emergency that can't wait, call 9-1-1 and get to the nearest hospital. Otherwise, call your health care provider.  · Afterward, the reaction should be reported to the Vaccine Adverse Event Reporting System (VAERS). Your doctor should file this report, or you can do it yourself through the VAERS web site at www.vaers.hhs.gov or by calling 1-800-822-7967.  VAERS does not give medical advice.  6. How can I learn more?  · Ask your healthcare provider. He or she can give you the vaccine package insert or suggest other sources of information.  · Call your local or state health department.  · Contact the Centers for Disease Control and Prevention (CDC):  ? Call 1-800-232-4636(1-800-CDC-INFO) or  ? Visit CDC's website at www.cdc.gov/vaccines  CDC Vaccine Information Statement (VIS) Live Zoster Vaccine (10/05/2016)  This information is not intended to replace advice given to you by your health care provider. Make sure you discuss any questions you have with your health care provider.  Document Released: 06/07/2006 Document Revised: 10/20/2016 Document Reviewed: 10/20/2016  Elsevier Interactive Patient Education © 2018 [employer]

## 2017-09-15 ENCOUNTER — Telehealth: Payer: Self-pay | Admitting: Osteopathic Medicine

## 2017-09-15 NOTE — Telephone Encounter (Addendum)
Called patient - advised of first Shingrix vaccination being give in the wrong location - no revaccination is needed. Patient stated he is doing fine - arm just a little sore but that's all.   Reminded patient to make appointment to have 2nd vaccine in between 2-6 months. Patient stated he's already scheduled  that appointment.

## 2017-09-15 NOTE — Telephone Encounter (Signed)
I received notification that the first shingrix vaccine was administered yesterday in the wrong location. It was given subcutaneously instead of intramuscularly.   This information was reported to [facility]. I was contacted today by Elsie Provider, Dr. Redmond Pulling. She advised the Pt does not have to get revaccinated, they will just be at an increased risk for pain/swelling. Pt will be due for next injection in 2-6 months.

## 2017-10-17 ENCOUNTER — Other Ambulatory Visit: Payer: Self-pay | Admitting: Osteopathic Medicine

## 2017-10-17 DIAGNOSIS — N529 Male erectile dysfunction, unspecified: Secondary | ICD-10-CM

## 2017-11-10 ENCOUNTER — Ambulatory Visit: Payer: BC Managed Care – PPO

## 2017-12-01 ENCOUNTER — Ambulatory Visit (INDEPENDENT_AMBULATORY_CARE_PROVIDER_SITE_OTHER): Payer: BC Managed Care – PPO | Admitting: Sports Medicine

## 2017-12-01 ENCOUNTER — Ambulatory Visit (INDEPENDENT_AMBULATORY_CARE_PROVIDER_SITE_OTHER): Payer: BC Managed Care – PPO

## 2017-12-01 ENCOUNTER — Encounter: Payer: Self-pay | Admitting: Sports Medicine

## 2017-12-01 VITALS — BP 131/78 | HR 64 | Resp 16 | Wt 226.0 lb

## 2017-12-01 DIAGNOSIS — M25571 Pain in right ankle and joints of right foot: Secondary | ICD-10-CM | POA: Diagnosis not present

## 2017-12-01 DIAGNOSIS — Z23 Encounter for immunization: Secondary | ICD-10-CM

## 2017-12-01 DIAGNOSIS — G8929 Other chronic pain: Secondary | ICD-10-CM | POA: Diagnosis not present

## 2017-12-01 MED ORDER — MELOXICAM 15 MG PO TABS
ORAL_TABLET | ORAL | 3 refills | Status: DC
Start: 2017-12-01 — End: 2017-12-29

## 2017-12-01 NOTE — Progress Notes (Signed)
Subjective:    I'm seeing this patient as a consultation for: Dr. Emeterio Reeve  CC: Ankle pain  HPI: This is a pleasant 57 year old male, he has had pain in his right ankle, lateral aspect for about 2 months now, he does not really recall an injury, swelling, moderate, persistent, localized without radiation, no mechanical symptoms.  I reviewed the past medical history, family history, social history, surgical history, and allergies today and no changes were needed.  Please see the problem list section below in epic for further details.  Past Medical History: Past Medical History:  Diagnosis Date  . BPH (benign prostatic hypertrophy) with urinary retention 04/18/2013  . Erectile dysfunction 04/18/2013  . Family history of pancreatic cancer 04/18/2013  . Hyperlipidemia 04/18/2013  . Prostatitis    Past Surgical History: Past Surgical History:  Procedure Laterality Date  . COLONOSCOPY    . lipoma removal  2014   left chest   Social History: Social History   Socioeconomic History  . Marital status: Married    Spouse name: Not on file  . Number of children: Not on file  . Years of education: Not on file  . Highest education level: Not on file  Occupational History  . Not on file  Social Needs  . Financial resource strain: Not on file  . Food insecurity:    Worry: Not on file    Inability: Not on file  . Transportation needs:    Medical: Not on file    Non-medical: Not on file  Tobacco Use  . Smoking status: Never Smoker  . Smokeless tobacco: Never Used  Substance and Sexual Activity  . Alcohol use: Yes    Alcohol/week: 2.4 oz    Types: 4 Cans of beer per week  . Drug use: No  . Sexual activity: Not on file  Lifestyle  . Physical activity:    Days per week: Not on file    Minutes per session: Not on file  . Stress: Not on file  Relationships  . Social connections:    Talks on phone: Not on file    Gets together: Not on file    Attends religious service:  Not on file    Active member of club or organization: Not on file    Attends meetings of clubs or organizations: Not on file    Relationship status: Not on file  Other Topics Concern  . Not on file  Social History Narrative  . Not on file   Family History: Family History  Problem Relation Age of Onset  . Breast cancer Mother   . Pancreatic cancer Father   . Pancreatic cancer Paternal Uncle   . Colon cancer Neg Hx   . Colon polyps Neg Hx   . Esophageal cancer Neg Hx   . Rectal cancer Neg Hx   . Stomach cancer Neg Hx    Allergies: No Known Allergies Medications: See med rec.  Review of Systems: No headache, visual changes, nausea, vomiting, diarrhea, constipation, dizziness, abdominal pain, skin rash, fevers, chills, night sweats, weight loss, swollen lymph nodes, body aches, joint swelling, muscle aches, chest pain, shortness of breath, mood changes, visual or auditory hallucinations.   Objective:   General: Well Developed, well nourished, and in no acute distress.  Neuro:  Extra-ocular muscles intact, able to move all 4 extremities, sensation grossly intact.  Deep tendon reflexes tested were normal. Psych: Alert and oriented, mood congruent with affect. ENT:  Ears and nose appear unremarkable.  Hearing grossly normal. Neck: Unremarkable overall appearance, trachea midline.  No visible thyroid enlargement. Eyes: Conjunctivae and lids appear unremarkable.  Pupils equal and round. Skin: Warm and dry, no rashes noted.  Cardiovascular: Pulses palpable, no extremity edema. Right ankle: Swollen, tenderness over the ATFL, slightly loose lateral ligaments but comparable to the contralateral side. Range of motion is full in all directions. Strength is 5/5 in all directions. Stable lateral and medial ligaments; squeeze test and kleiger test unremarkable; Talar dome nontender; No pain at base of 5th MT; No tenderness over cuboid; No tenderness over N spot or navicular prominence No  tenderness on posterior aspects of lateral and medial malleolus No sign of peroneal tendon subluxations; Negative tarsal tunnel tinel's Able to walk 4 steps.  Impression and Recommendations:   This case required medical decision making of moderate complexity.  Right ankle pain Pain that started approximately 2 months ago, patient is unsure whether or not he had an inversion injury. Pain is referrable to the ATFL, only minimal laxity with the ankle. ASO lace up brace, meloxicam, rehab exercises, x-rays.   Return in 2 weeks, formal physical therapy if no better. ___________________________________________ [name] [name], M.D., ABFM., CAQSM. Primary Care and [place] Instructor of [place] of [facility] of Medicine

## 2017-12-01 NOTE — Assessment & Plan Note (Signed)
Pain that started approximately 2 months ago, patient is unsure whether or not he had an inversion injury. Pain is referrable to the ATFL, only minimal laxity with the ankle. ASO lace up brace, meloxicam, rehab exercises, x-rays.   Return in 2 weeks, formal physical therapy if no better.

## 2017-12-15 ENCOUNTER — Ambulatory Visit: Payer: BC Managed Care – PPO | Admitting: Sports Medicine

## 2017-12-20 ENCOUNTER — Ambulatory Visit: Payer: BC Managed Care – PPO | Admitting: Sports Medicine

## 2017-12-27 ENCOUNTER — Ambulatory Visit: Payer: BC Managed Care – PPO | Admitting: Sports Medicine

## 2017-12-29 ENCOUNTER — Ambulatory Visit (INDEPENDENT_AMBULATORY_CARE_PROVIDER_SITE_OTHER): Payer: BC Managed Care – PPO | Admitting: Sports Medicine

## 2017-12-29 ENCOUNTER — Encounter: Payer: Self-pay | Admitting: Sports Medicine

## 2017-12-29 DIAGNOSIS — G8929 Other chronic pain: Secondary | ICD-10-CM | POA: Diagnosis not present

## 2017-12-29 DIAGNOSIS — M25571 Pain in right ankle and joints of right foot: Secondary | ICD-10-CM

## 2017-12-29 MED ORDER — NAPROXEN-ESOMEPRAZOLE 500-20 MG PO TBEC: 1.0000 | DELAYED_RELEASE_TABLET | Freq: Two times a day (BID) | ORAL | 0 refills | Status: DC

## 2017-12-29 NOTE — Progress Notes (Signed)
Subjective:    CC: Follow-up  HPI: This is a pleasant 57 year old male, approximately 6 weeks ago he had an inversion injury to his right ankle, severe swelling and pain.  X-rays were negative, we placed him in an ASO, treated him conservatively, he returns today with unfortunate persistence of pain.  Symptoms are moderate, persistent.  I reviewed the past medical history, family history, social history, surgical history, and allergies today and no changes were needed.  Please see the problem list section below in epic for further details.  Past Medical History: Past Medical History:  Diagnosis Date  . BPH (benign prostatic hypertrophy) with urinary retention 04/18/2013  . Erectile dysfunction 04/18/2013  . Family history of pancreatic cancer 04/18/2013  . Hyperlipidemia 04/18/2013  . Prostatitis    Past Surgical History: Past Surgical History:  Procedure Laterality Date  . COLONOSCOPY    . lipoma removal  2014   left chest   Social History: Social History   Socioeconomic History  . Marital status: Married    Spouse name: Not on file  . Number of children: Not on file  . Years of education: Not on file  . Highest education level: Not on file  Occupational History  . Not on file  Social Needs  . Financial resource strain: Not on file  . Food insecurity:    Worry: Not on file    Inability: Not on file  . Transportation needs:    Medical: Not on file    Non-medical: Not on file  Tobacco Use  . Smoking status: Never Smoker  . Smokeless tobacco: Never Used  Substance and Sexual Activity  . Alcohol use: Yes    Alcohol/week: 2.4 oz    Types: 4 Cans of beer per week  . Drug use: No  . Sexual activity: Not on file  Lifestyle  . Physical activity:    Days per week: Not on file    Minutes per session: Not on file  . Stress: Not on file  Relationships  . Social connections:    Talks on phone: Not on file    Gets together: Not on file    Attends religious service: Not  on file    Active member of club or organization: Not on file    Attends meetings of clubs or organizations: Not on file    Relationship status: Not on file  Other Topics Concern  . Not on file  Social History Narrative  . Not on file   Family History: Family History  Problem Relation Age of Onset  . Breast cancer Mother   . Pancreatic cancer Father   . Pancreatic cancer Paternal Uncle   . Colon cancer Neg Hx   . Colon polyps Neg Hx   . Esophageal cancer Neg Hx   . Rectal cancer Neg Hx   . Stomach cancer Neg Hx    Allergies: No Known Allergies Medications: See med rec.  Review of Systems: No fevers, chills, night sweats, weight loss, chest pain, or shortness of breath.   Objective:    General: Well Developed, well nourished, and in no acute distress.  Neuro: Alert and oriented x3, extra-ocular muscles intact, sensation grossly intact.  HEENT: Normocephalic, atraumatic, pupils equal round reactive to light, neck supple, no masses, no lymphadenopathy, thyroid nonpalpable.  Skin: Warm and dry, no rashes. Cardiac: Regular rate and rhythm, no murmurs rubs or gallops, no lower extremity edema.  Respiratory: Clear to auscultation bilaterally. Not using accessory muscles, speaking in full  sentences. Right ankle: Still swollen Range of motion is full in all directions. Strength is 5/5 in all directions. Slightly abnormal anterior drawer sign, tenderness over the ATFL Talar dome nontender; No pain at base of 5th MT; No tenderness over cuboid; No tenderness over N spot or navicular prominence No tenderness on posterior aspects of lateral and medial malleolus No sign of peroneal tendon subluxations; Negative tarsal tunnel tinel's Able to walk 4 steps.  Impression and Recommendations:    Right ankle pain 1 month post injury, persistent pain, question occult fracture. MRI, formal physical therapy for 1 session, switching to Vimovo. Continue brace. Return to go over MRI  results.  I spent 25 minutes with this patient, greater than 50% was face-to-face time counseling regarding the above diagnoses ___________________________________________ [name] [name], M.D., ABFM., CAQSM. Primary Care and [place] Instructor of [place] of [facility] of Medicine

## 2017-12-29 NOTE — Assessment & Plan Note (Signed)
1 month post injury, persistent pain, question occult fracture. MRI, formal physical therapy for 1 session, switching to Vimovo. Continue brace. Return to go over MRI results.

## 2018-01-03 ENCOUNTER — Ambulatory Visit (INDEPENDENT_AMBULATORY_CARE_PROVIDER_SITE_OTHER): Payer: BC Managed Care – PPO

## 2018-01-03 DIAGNOSIS — X58XXXD Exposure to other specified factors, subsequent encounter: Secondary | ICD-10-CM

## 2018-01-03 DIAGNOSIS — S93691D Other sprain of right foot, subsequent encounter: Secondary | ICD-10-CM

## 2018-01-04 ENCOUNTER — Ambulatory Visit: Payer: BC Managed Care – PPO | Admitting: Physical Therapy

## 2018-01-04 ENCOUNTER — Encounter: Payer: Self-pay | Admitting: Sports Medicine

## 2018-01-04 ENCOUNTER — Ambulatory Visit (INDEPENDENT_AMBULATORY_CARE_PROVIDER_SITE_OTHER): Payer: BC Managed Care – PPO | Admitting: Sports Medicine

## 2018-01-04 DIAGNOSIS — M25571 Pain in right ankle and joints of right foot: Secondary | ICD-10-CM | POA: Diagnosis not present

## 2018-01-04 DIAGNOSIS — G8929 Other chronic pain: Secondary | ICD-10-CM | POA: Diagnosis not present

## 2018-01-04 MED ORDER — TRAMADOL HCL 50 MG PO TABS: 50.0000 mg | ORAL_TABLET | Freq: Three times a day (TID) | ORAL | 0 refills | Status: DC | PRN

## 2018-01-04 NOTE — Progress Notes (Signed)
Subjective:    CC: Follow-up  HPI: This is a pleasant 57 year old male, I saw him after an inversion injury, he did not improve after 6 weeks of conservative measures so we obtained an MRI the results of which will be dictated below, he still has pain over the anterolateral and posterolateral aspects of his right ankle.  I reviewed the past medical history, family history, social history, surgical history, and allergies today and no changes were needed.  Please see the problem list section below in epic for further details.  Past Medical History: Past Medical History:  Diagnosis Date  . BPH (benign prostatic hypertrophy) with urinary retention 04/18/2013  . Erectile dysfunction 04/18/2013  . Family history of pancreatic cancer 04/18/2013  . Hyperlipidemia 04/18/2013  . Prostatitis    Past Surgical History: Past Surgical History:  Procedure Laterality Date  . COLONOSCOPY    . lipoma removal  2014   left chest   Social History: Social History   Socioeconomic History  . Marital status: Married    Spouse name: Not on file  . Number of children: Not on file  . Years of education: Not on file  . Highest education level: Not on file  Occupational History  . Not on file  Social Needs  . Financial resource strain: Not on file  . Food insecurity:    Worry: Not on file    Inability: Not on file  . Transportation needs:    Medical: Not on file    Non-medical: Not on file  Tobacco Use  . Smoking status: Never Smoker  . Smokeless tobacco: Never Used  Substance and Sexual Activity  . Alcohol use: Yes    Alcohol/week: 2.4 oz    Types: 4 Cans of beer per week  . Drug use: No  . Sexual activity: Not on file  Lifestyle  . Physical activity:    Days per week: Not on file    Minutes per session: Not on file  . Stress: Not on file  Relationships  . Social connections:    Talks on phone: Not on file    Gets together: Not on file    Attends religious service: Not on file   Active member of club or organization: Not on file    Attends meetings of clubs or organizations: Not on file    Relationship status: Not on file  Other Topics Concern  . Not on file  Social History Narrative  . Not on file   Family History: Family History  Problem Relation Age of Onset  . Breast cancer Mother   . Pancreatic cancer Father   . Pancreatic cancer Paternal Uncle   . Colon cancer Neg Hx   . Colon polyps Neg Hx   . Esophageal cancer Neg Hx   . Rectal cancer Neg Hx   . Stomach cancer Neg Hx    Allergies: No Known Allergies Medications: See med rec.  Review of Systems: No fevers, chills, night sweats, weight loss, chest pain, or shortness of breath.   Objective:    General: Well Developed, well nourished, and in no acute distress.  Neuro: Alert and oriented x3, extra-ocular muscles intact, sensation grossly intact.  HEENT: Normocephalic, atraumatic, pupils equal round reactive to light, neck supple, no masses, no lymphadenopathy, thyroid nonpalpable.  Skin: Warm and dry, no rashes. Cardiac: Regular rate and rhythm, no murmurs rubs or gallops, no lower extremity edema.  Respiratory: Clear to auscultation bilaterally. Not using accessory muscles, speaking in full sentences.  MRI personally reviewed, he has complete tears of the ATFL, CFL as well as a longitudinal split tear of the peroneal brevis.  Short leg walking cast applied.  Impression and Recommendations:    Right ankle pain MRI does show complete tears of the ATFL, CFL, as well as a longitudinal split tear of the peroneus brevis. Short leg cast placed, walking shoe on the bottom. Return to see me in 3 weeks (before his trip), we will remove the cast and likely placement to the boot, he has one at home and will bring this for inspection. Tramadol for pain.  I spent 25 minutes with this patient, greater than 50% was face-to-face time counseling regarding the above diagnoses, this was separate from the time  spent placing the cast ___________________________________________ [name] [name], M.D., ABFM., CAQSM. Primary Care and [place] Instructor of [place] of [facility] of Medicine

## 2018-01-04 NOTE — Assessment & Plan Note (Signed)
MRI does show complete tears of the ATFL, CFL, as well as a longitudinal split tear of the peroneus brevis. Short leg cast placed, walking shoe on the bottom. Return to see me in 3 weeks (before his trip), we will remove the cast and likely placement to the boot, he has one at home and will bring this for inspection. Tramadol for pain.

## 2018-01-19 ENCOUNTER — Telehealth: Payer: Self-pay | Admitting: Sports Medicine

## 2018-01-19 NOTE — Telephone Encounter (Signed)
If it is simply cracked and still doing its job I do not think we need to put a new cast, I could simply wrap it with a few more layers of fiberglass if he would like.  Kelsi could do that in a nurse visit.

## 2018-01-19 NOTE — Telephone Encounter (Signed)
Called pt and informed of MD instructions. He states it is doing fine and will return next week to have cast removed. KG LPN

## 2018-01-19 NOTE — Telephone Encounter (Signed)
Pt calls and states that hs cast is cracked on the sole of his foot. The foot is not moving at all as he has duct taped the crack. Due to have cast off next week. Pt wants to know if needs to be recasted or just wait. Please advise

## 2018-01-25 ENCOUNTER — Ambulatory Visit (INDEPENDENT_AMBULATORY_CARE_PROVIDER_SITE_OTHER): Payer: BC Managed Care – PPO | Admitting: Sports Medicine

## 2018-01-25 ENCOUNTER — Encounter: Payer: Self-pay | Admitting: Sports Medicine

## 2018-01-25 DIAGNOSIS — G8929 Other chronic pain: Secondary | ICD-10-CM | POA: Diagnosis not present

## 2018-01-25 DIAGNOSIS — M25571 Pain in right ankle and joints of right foot: Secondary | ICD-10-CM

## 2018-01-25 NOTE — Progress Notes (Signed)
Subjective:    CC: Recheck foot injury  HPI: Raymond Craig is a pleasant 57 year old male, he had a severe inversion injury about 2 months ago, we obtained an MRI that showed complete tears of the ATFL, CFL and a longitudinal split tear of the peroneus brevis.  We placed him in a cast for the last 3 weeks.  I reviewed the past medical history, family history, social history, surgical history, and allergies today and no changes were needed.  Please see the problem list section below in epic for further details.  Past Medical History: Past Medical History:  Diagnosis Date  . BPH (benign prostatic hypertrophy) with urinary retention 04/18/2013  . Erectile dysfunction 04/18/2013  . Family history of pancreatic cancer 04/18/2013  . Hyperlipidemia 04/18/2013  . Prostatitis    Past Surgical History: Past Surgical History:  Procedure Laterality Date  . COLONOSCOPY    . lipoma removal  2014   left chest   Social History: Social History   Socioeconomic History  . Marital status: Married    Spouse name: Not on file  . Number of children: Not on file  . Years of education: Not on file  . Highest education level: Not on file  Occupational History  . Not on file  Social Needs  . Financial resource strain: Not on file  . Food insecurity:    Worry: Not on file    Inability: Not on file  . Transportation needs:    Medical: Not on file    Non-medical: Not on file  Tobacco Use  . Smoking status: Never Smoker  . Smokeless tobacco: Never Used  Substance and Sexual Activity  . Alcohol use: Yes    Alcohol/week: 2.4 oz    Types: 4 Cans of beer per week  . Drug use: No  . Sexual activity: Not on file  Lifestyle  . Physical activity:    Days per week: Not on file    Minutes per session: Not on file  . Stress: Not on file  Relationships  . Social connections:    Talks on phone: Not on file    Gets together: Not on file    Attends religious service: Not on file    Active member of club or  organization: Not on file    Attends meetings of clubs or organizations: Not on file    Relationship status: Not on file  Other Topics Concern  . Not on file  Social History Narrative  . Not on file   Family History: Family History  Problem Relation Age of Onset  . Breast cancer Mother   . Pancreatic cancer Father   . Pancreatic cancer Paternal Uncle   . Colon cancer Neg Hx   . Colon polyps Neg Hx   . Esophageal cancer Neg Hx   . Rectal cancer Neg Hx   . Stomach cancer Neg Hx    Allergies: No Known Allergies Medications: See med rec.  Review of Systems: No fevers, chills, night sweats, weight loss, chest pain, or shortness of breath.   Objective:    General: Well Developed, well nourished, and in no acute distress.  Neuro: Alert and oriented x3, extra-ocular muscles intact, sensation grossly intact.  HEENT: Normocephalic, atraumatic, pupils equal round reactive to light, neck supple, no masses, no lymphadenopathy, thyroid nonpalpable.  Skin: Warm and dry, no rashes. Cardiac: Regular rate and rhythm, no murmurs rubs or gallops, no lower extremity edema.  Respiratory: Clear to auscultation bilaterally. Not using accessory muscles, speaking  in full sentences. Right leg: Cast is removed, really has no tenderness over the ATFL, CFL, or peroneus brevis.  Expected stiffness and loss of range of motion due to the immobilization.  Impression and Recommendations:    Right ankle pain Complete tears of the ATFL, CFL, longitudinal split tear of the peroneus brevis. Short leg cast has been on for 3 weeks now, discontinued today, transfer into boot. Adding formal physical therapy. Return to see me in 1 month.  I spent 25 minutes with this patient, greater than 50% was face-to-face time counseling regarding the above diagnoses ___________________________________________ [name] [name], M.D., ABFM., CAQSM. Primary Care and [place] Instructor of [place] of [facility] of Medicine

## 2018-01-25 NOTE — Assessment & Plan Note (Signed)
Complete tears of the ATFL, CFL, longitudinal split tear of the peroneus brevis. Short leg cast has been on for 3 weeks now, discontinued today, transfer into boot. Adding formal physical therapy. Return to see me in 1 month.

## 2018-01-27 ENCOUNTER — Encounter: Payer: Self-pay | Admitting: Physical Therapy

## 2018-01-27 ENCOUNTER — Ambulatory Visit: Payer: BC Managed Care – PPO | Admitting: Physical Therapy

## 2018-01-27 DIAGNOSIS — M25671 Stiffness of right ankle, not elsewhere classified: Secondary | ICD-10-CM | POA: Diagnosis not present

## 2018-01-27 DIAGNOSIS — M25471 Effusion, right ankle: Secondary | ICD-10-CM

## 2018-01-27 DIAGNOSIS — M6281 Muscle weakness (generalized): Secondary | ICD-10-CM | POA: Diagnosis not present

## 2018-01-27 DIAGNOSIS — R2689 Other abnormalities of gait and mobility: Secondary | ICD-10-CM | POA: Diagnosis not present

## 2018-01-27 NOTE — Therapy (Signed)
[facility] [place], [place], [address] Phone: [phone]   Fax:  [phone]  Physical Therapy Evaluation  Patient Details  Name: Raymond Craig MRN: [id number] Date of Birth: 08-Mar-1961 Referring Provider: Dr [name]   Encounter Date: 01/27/2018  PT End of Session - 01/27/18 1435    Visit Number  1    Number of Visits  6    Date for PT Re-Evaluation  03/24/18    PT Start Time  1435    PT Stop Time  1541    PT Time Calculation (min)  66 min    Activity Tolerance  Patient tolerated treatment well       Past Medical History:  Diagnosis Date  . BPH (benign prostatic hypertrophy) with urinary retention 04/18/2013  . Erectile dysfunction 04/18/2013  . Family history of pancreatic cancer 04/18/2013  . Hyperlipidemia 04/18/2013  . Prostatitis     Past Surgical History:  Procedure Laterality Date  . COLONOSCOPY    . lipoma removal  2014   left chest    There were no vitals filed for this visit.   Subjective Assessment - 01/27/18 1435    Subjective  Raymond Craig had an inversion Rt ankle sprain about 2 months ago with resultant tears of ligaments.  He was just taken out of a cast that he had on for 3 wks and in in a CAM boot now. HE is going out of town for 2 weeks starting tomorrow.     How long can you walk comfortably?  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     Currently in Pain?  -- has pain after being up on the foot it goes up to 4/10.          [facility] PT Assessment - 01/27/18 0001      Assessment   Medical Diagnosis  Rt ankle - torn ATFL, CFL, split peroneal brevis    Referring Provider  Dr [name]    Onset Date/Surgical Date  05/29/17    Hand Dominance  Right    Next MD Visit  02/22/18    Prior Therapy  not for  this      Precautions   Precaution Comments  no swimming yet    Required Braces or Orthoses  -- CAM boot when up      Restrictions   Weight Bearing Restrictions  No      Balance Screen   Has the patient fallen in the past 6 months  No      [place] residence    Living Arrangements  Spouse/significant other;Children    [place]  Two level taking one step at a time      Prior Function   Level of Independence  Independent    Vocation  Full time employment    Equities trader    Leisure  exercise,       Observation/Other Assessments   Focus on Therapeutic Outcomes (FOTO)   58% limited      Observation/Other Assessments-Edema    Edema  Circumferential (+) in Rt ankle and foot      Circumferential Edema   Circumferential - Right  27.8cm at malleoli  Circumferential - Left   26 cm at malleoli      Functional Tests   Functional tests  Single leg stance      Single Leg Stance   Comments  Lt 8 sec, Rt 2 sec       Posture/Postural Control   Posture/Postural Control  Postural limitations    Postural Limitations  -- pes planus Rt       ROM / Strength   AROM / PROM / Strength  AROM;Strength;PROM      AROM   AROM Assessment Site  Ankle    Right/Left Ankle  Left;Right    Right Ankle Dorsiflexion  1    Right Ankle Plantar Flexion  52    Right Ankle Inversion  12    Right Ankle Eversion  14    Left Ankle Dorsiflexion  17    Left Ankle Plantar Flexion  -- WNL    Left Ankle Inversion  25    Left Ankle Eversion  37      PROM   PROM Assessment Site  Ankle    Right/Left Ankle  Right    Right Ankle Dorsiflexion  8    Right Ankle Inversion  19    Right Ankle Eversion  25      Strength   Overall Strength Comments  great toe extension Rt 58, Lt 67    Strength Assessment Site  Hip;Knee;Ankle    Right/Left Hip  -- bilat WNL except hip abduction 5-/5    Right/Left Knee  -- WNL except Rt quads 5-/5    Right/Left Ankle   Right Lt WNL    Right Ankle Dorsiflexion  4+/5    Right Ankle Inversion  3+/5    Right Ankle Eversion  4/5      Flexibility   Soft Tissue Assessment /Muscle Length  -- LE's WFL      Palpation   Palpation comment  very tender and tight around medial/lateral Rt ankle tightness in all the mucles of the lower Rt leg.       Ambulation/Gait   Ambulation/Gait  -- antalgic with CAM boot on Rt LE                Objective measurements completed on examination: See above findings.      [facility] Adult PT Treatment/Exercise - 01/27/18 0001      Self-Care   Self-Care  Other Self-Care Comments    Other Self-Care Comments   pt educated on self massage with roller stick and ball to plantar surface ; pt returned demo with cues.       Modalities   Modalities  Vasopneumatic      Vasopneumatic   Number Minutes Vasopneumatic   15 minutes    Vasopnuematic Location   Ankle Rt    Vasopneumatic Pressure  Low    Vasopneumatic Temperature   3*      Ankle Exercises: Stretches   Gastroc Stretch  3 reps;30 seconds    Other Stretch  Rt foot neutral rolling through to balls of foot (limited weight in foot, to stretch through toe flexors)      Ankle Exercises: Standing   SLS  Rt SLS x 3 reps, up to 5 sec without UE assist.       Ankle Exercises: Seated   ABC's  1 rep    Ankle Circles/Pumps  AROM;Right;10 reps    Heel Raises  Both;20 reps    Toe Raise  20 reps  PT Education - 01/27/18 1528    Education Details  HEP, self massage, POC    Person(s) Educated  Patient    Methods  Explanation;Demonstration;Handout;Verbal cues    Comprehension  Returned demonstration;Verbalized understanding       PT Short Term Goals - 01/27/18 1512      PT SHORT TERM GOAL #1   Title  re-assess ROM/strength/proprioception Rt ankle ( 02/17/18)    Time  3    Period  Weeks    Status  New      PT SHORT TERM GOAL #2   Title  demo Rt ankle ROM WNL ( 02/17/18)     Time  3    Period  Weeks     Status  New      PT SHORT TERM GOAL #3   Title  I with initial HEP ( 02/17/18)     Time  3    Period  Weeks    Status  New      PT SHORT TERM GOAL #4   Title  circumfrential measurements for ankle edema within 0.50 cm of each other ( 02/17/18)     Time  3    Period  Weeks    Status  New        PT Long Term Goals - 01/27/18 1514      PT LONG TERM GOAL #1   Title  I with advanced HEP to include return to swimming and walking program ( 03/24/18)     Time  8    Period  Weeks    Status  New      PT LONG TERM GOAL #2   Title  demo Rt ankle/knee strength WNL to allow return to PLOF with walking ( 03/24/18)     Time  8    Period  Weeks    Status  New      PT LONG TERM GOAL #3   Title  ambulate on even surfaces and up/down stairs without brace and normal gait pattern ( 03/24/18)     Time  8    Period  Weeks    Status  New      PT LONG TERM GOAL #4   Title  improve FOTO =/< 37% limited ( 03/24/18)     Time  8    Period  Weeks    Status  New      PT LONG TERM GOAL #5   Title  report no more than 2/10 pain after being up on his leg for multiple hours ( 03/24/18)     Time  8    Period  Weeks    Status  New             Plan - 01/27/18 1509    Clinical Impression Statement  57 yo male s/p severe Rt ankle inversion sprain with resultant tears in ligaments and peroneal brevis.  He is 2 days out of a cast, presents with limited Rt ankle ROM and strength along with impaired proprioception, tightness and tenderness in the Rt lower leg and edema around the Rt ankle joint.  He is going out of town tomorrow for 2 weeks on vacation. Currently he is wearing a CAM boot and is very limited in his tolerance to ambulation.     Clinical Presentation  Stable    Clinical Decision Making  Low    Rehab Potential  Excellent    PT Frequency  1x / week  PT Duration  8 weeks however will be out of town for 2 weeks    PT Treatment/Interventions  Gait training;Neuromuscular re-education;Dry  needling;Manual techniques;Moist Heat;Ultrasound;Therapeutic activities;Patient/family education;Taping;Vasopneumatic Device;Therapeutic exercise;Cryotherapy;Electrical Stimulation;Passive range of motion    PT Next Visit Plan  re-assess how he is doing after vacation and progress HEP,  pt has very high co-pay and would like to do as much at home as possible.     Consulted and Agree with Plan of Care  Patient       Patient will benefit from skilled therapeutic intervention in order to improve the following deficits and impairments:  Abnormal gait, Pain, Increased muscle spasms, Decreased range of motion, Decreased strength, Increased edema, Difficulty walking, Decreased balance  Visit Diagnosis: Stiffness of right ankle, not elsewhere classified - Plan: PT plan of care cert/re-cert  Swelling of ankle, right - Plan: PT plan of care cert/re-cert  Muscle weakness (generalized) - Plan: PT plan of care cert/re-cert  Other abnormalities of gait and mobility - Plan: PT plan of care cert/re-cert     Problem List Patient Active Problem List   Diagnosis Date Noted  . Right ankle pain 12/01/2017  . Complicated UTI (urinary tract infection) 09/15/2016  . Insomnia 11/26/2015  . Hyperglycemia 11/22/2014  . Left nephrolithiasis 02/26/2014  . History of colonic polyps 06/30/2013  . Hyperlipidemia 04/18/2013  . Erectile dysfunction 04/18/2013  . Benign prostatic hyperplasia with urinary retention 04/18/2013  . Family history of pancreatic cancer 04/18/2013    Jeral Pinch PT  01/27/2018, 3:53 PM  [facility] [place], [place], [address] Phone: [phone]   Fax:  [phone]  Name: Raymond Craig MRN: [id number] Date of Birth: 1960-10-06

## 2018-02-10 ENCOUNTER — Ambulatory Visit: Payer: BC Managed Care – PPO | Admitting: Physical Therapy

## 2018-02-10 ENCOUNTER — Encounter: Payer: Self-pay | Admitting: Physical Therapy

## 2018-02-10 DIAGNOSIS — M25671 Stiffness of right ankle, not elsewhere classified: Secondary | ICD-10-CM | POA: Diagnosis not present

## 2018-02-10 DIAGNOSIS — M25471 Effusion, right ankle: Secondary | ICD-10-CM

## 2018-02-10 DIAGNOSIS — R2689 Other abnormalities of gait and mobility: Secondary | ICD-10-CM

## 2018-02-10 DIAGNOSIS — M6281 Muscle weakness (generalized): Secondary | ICD-10-CM

## 2018-02-10 NOTE — Therapy (Signed)
[place], [place], [address] Phone: [phone]   Fax:  [phone]  Physical Therapy Treatment  Patient Details  Name: Raymond Craig MRN: [id number] Date of Birth: 11/18/1960 Referring Provider: Dr [name]   Encounter Date: 02/10/2018  PT End of Session - 02/10/18 1532    Visit Number  2    Number of Visits  6    Date for PT Re-Evaluation  03/24/18    PT Start Time  1532    PT Stop Time  1631    PT Time Calculation (min)  59 min    Activity Tolerance  Patient tolerated treatment well       Past Medical History:  Diagnosis Date  . BPH (benign prostatic hypertrophy) with urinary retention 04/18/2013  . Erectile dysfunction 04/18/2013  . Family history of pancreatic cancer 04/18/2013  . Hyperlipidemia 04/18/2013  . Prostatitis     Past Surgical History:  Procedure Laterality Date  . COLONOSCOPY    . lipoma removal  2014   left chest    There were no vitals filed for this visit.  Subjective Assessment - 02/10/18 1532    Subjective  Raymond Craig continues with the CAM, had difficulty with driving more than 20' so his kids drove most of the time. 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     Currently in Pain?  -- tightness in the Rt calf, foot /ankle hurts at the end of the day, not hurting now         Raymond Craig PT Assessment - 02/10/18 0001      Assessment   Medical Diagnosis  Rt ankle - torn ATFL, CFL, split peroneal brevis    Referring Provider  Dr [name]      Observation/Other Assessments-Edema    Edema  Circumferential      Circumferential Edema   Circumferential - Right  27.7    Circumferential - Left   26.2      Single Leg Stance   Comments  Rt 15 sec       AROM   Right Ankle Dorsiflexion  11    Right Ankle Inversion  30    Right Ankle Eversion  35      PROM   Right/Left Ankle  Right    Right Ankle Dorsiflexion  14      Strength   Right Ankle Inversion  4/5    Right Ankle Eversion  4+/5                   [facility] Adult PT Treatment/Exercise - 02/10/18 0001      Modalities   Modalities  Vasopneumatic      Vasopneumatic   Number Minutes Vasopneumatic   20 minutes    Vasopnuematic Location   Knee Rt     Vasopneumatic Pressure  Medium    Vasopneumatic Temperature   3*      Ankle Exercises: Aerobic   Stationary Bike  L2x5'      Ankle Exercises: Stretches   Soleus Stretch  2 reps;30 seconds    Gastroc Stretch  2 reps;30 seconds      Ankle Exercises: Standing   SLS  Rt with FWD leans 3x10    Heel Raises  10 reps;Both each toes straight, out, in      Ankle Exercises:  Seated   Other Seated Ankle Exercises  3x10 eversion bilat with red band               PT Short Term Goals - 02/10/18 1610      PT SHORT TERM GOAL #1   Title  re-assess ROM/strength/proprioception Rt ankle ( 02/17/18)    Status  Achieved      PT SHORT TERM GOAL #2   Title  demo Rt ankle ROM WNL ( 02/17/18)     Status  Partially Met      PT SHORT TERM GOAL #3   Title  I with initial HEP ( 02/17/18)     Status  Achieved      PT SHORT TERM GOAL #4   Title  circumfrential measurements for ankle edema within 0.50 cm of each other ( 02/17/18)     Status  On-going        PT Long Term Goals - 02/10/18 1535      PT LONG TERM GOAL #1   Title  I with advanced HEP to include return to swimming and walking program ( 03/24/18)     Status  On-going      PT LONG TERM GOAL #2   Title  demo Rt ankle/knee strength WNL to allow return to PLOF with walking ( 03/24/18)     Status  On-going      PT LONG TERM GOAL #3   Title  ambulate on even surfaces and up/down stairs without brace and normal gait pattern ( 03/24/18)     Status  On-going      PT LONG TERM GOAL #4   Title  improve FOTO =/< 37% limited ( 03/24/18)     Status  On-going      PT LONG TERM  GOAL #5   Title  report no more than 2/10 pain after being up on his leg for multiple hours ( 03/24/18)     Status  On-going            Plan - 02/10/18 1610    Clinical Impression Statement  Raymond Craig has improved Rt ankle ROM, strength and balance, he still has edema in the Rt ankle along with heat upon palpation. He has partially met his goals.  Still in the CAM boot, sees the MD in 2 weeks, hopefully he will be able to progress to an ASO. Fatigued with new HEP, recommend he continue with icing and elevation daily.  He will return to the [facility] to use the recumbent bike, nustep and gentle walking in the pool as able.     Rehab Potential  Excellent    PT Frequency  1x / week    PT Duration  8 weeks    PT Treatment/Interventions  Gait training;Neuromuscular re-education;Dry needling;Manual techniques;Moist Heat;Ultrasound;Therapeutic activities;Patient/family education;Taping;Vasopneumatic Device;Therapeutic exercise;Cryotherapy;Electrical Stimulation;Passive range of motion    PT Next Visit Plan  cont to progress HEP and proprioception as tolerates, if out of CAM progress walking program.     Consulted and Agree with Plan of Care  Patient       Patient will benefit from skilled therapeutic intervention in order to improve the following deficits and impairments:  Abnormal gait, Pain, Increased muscle spasms, Decreased range of motion, Decreased strength, Increased edema, Difficulty walking, Decreased balance  Visit Diagnosis: Stiffness of right ankle, not elsewhere classified  Swelling of ankle, right  Muscle weakness (generalized)  Other abnormalities of gait and mobility     Problem List Patient Active Problem  List   Diagnosis Date Noted  . Right ankle pain 12/01/2017  . Complicated UTI (urinary tract infection) 09/15/2016  . Insomnia 11/26/2015  . Hyperglycemia 11/22/2014  . Left nephrolithiasis 02/26/2014  . History of colonic polyps 06/30/2013  . Hyperlipidemia 04/18/2013  .  Erectile dysfunction 04/18/2013  . Benign prostatic hyperplasia with urinary retention 04/18/2013  . Family history of pancreatic cancer 04/18/2013    Raymond Craig PT  02/10/2018, 4:15 PM  [name], [place], [address] Phone: [phone]   Fax:  [phone]  Name: Raymond Craig MRN: [id number] Date of Birth: 1960/11/02

## 2018-02-22 ENCOUNTER — Encounter: Payer: Self-pay | Admitting: Sports Medicine

## 2018-02-22 ENCOUNTER — Ambulatory Visit (INDEPENDENT_AMBULATORY_CARE_PROVIDER_SITE_OTHER): Payer: BC Managed Care – PPO | Admitting: Sports Medicine

## 2018-02-22 VITALS — BP 125/83 | HR 66 | Ht 72.0 in | Wt 237.0 lb

## 2018-02-22 DIAGNOSIS — M25571 Pain in right ankle and joints of right foot: Secondary | ICD-10-CM | POA: Diagnosis not present

## 2018-02-22 DIAGNOSIS — G8929 Other chronic pain: Secondary | ICD-10-CM

## 2018-02-22 NOTE — Progress Notes (Signed)
Subjective:    CC: Recheck ankle  HPI: Raymond Craig is a pleasant 57 year old male, we have been treating him for a peroneal split tear, ATFL and CFL complete tears.  We had him in a cast for 3 weeks, boot for nearly a month and now an ASO.  He is been working hard with physical therapy and notes 70% improvement in symptoms with continued improvement.  I reviewed the past medical history, family history, social history, surgical history, and allergies today and no changes were needed.  Please see the problem list section below in epic for further details.  Past Medical History: Past Medical History:  Diagnosis Date  . BPH (benign prostatic hypertrophy) with urinary retention 04/18/2013  . Erectile dysfunction 04/18/2013  . Family history of pancreatic cancer 04/18/2013  . Hyperlipidemia 04/18/2013  . Prostatitis    Past Surgical History: Past Surgical History:  Procedure Laterality Date  . COLONOSCOPY    . lipoma removal  2014   left chest   Social History: Social History   Socioeconomic History  . Marital status: Married    Spouse name: Not on file  . Number of children: Not on file  . Years of education: Not on file  . Highest education level: Not on file  Occupational History  . Not on file  Social Needs  . Financial resource strain: Not on file  . Food insecurity:    Worry: Not on file    Inability: Not on file  . Transportation needs:    Medical: Not on file    Non-medical: Not on file  Tobacco Use  . Smoking status: Never Smoker  . Smokeless tobacco: Never Used  Substance and Sexual Activity  . Alcohol use: Yes    Alcohol/week: 2.4 oz    Types: 4 Cans of beer per week  . Drug use: No  . Sexual activity: Not on file  Lifestyle  . Physical activity:    Days per week: Not on file    Minutes per session: Not on file  . Stress: Not on file  Relationships  . Social connections:    Talks on phone: Not on file    Gets together: Not on file    Attends religious  service: Not on file    Active member of club or organization: Not on file    Attends meetings of clubs or organizations: Not on file    Relationship status: Not on file  Other Topics Concern  . Not on file  Social History Narrative  . Not on file   Family History: Family History  Problem Relation Age of Onset  . Breast cancer Mother   . Pancreatic cancer Father   . Pancreatic cancer Paternal Uncle   . Colon cancer Neg Hx   . Colon polyps Neg Hx   . Esophageal cancer Neg Hx   . Rectal cancer Neg Hx   . Stomach cancer Neg Hx    Allergies: No Known Allergies Medications: See med rec.  Review of Systems: No fevers, chills, night sweats, weight loss, chest pain, or shortness of breath.   Objective:    General: Well Developed, well nourished, and in no acute distress.  Neuro: Alert and oriented x3, extra-ocular muscles intact, sensation grossly intact.  HEENT: Normocephalic, atraumatic, pupils equal round reactive to light, neck supple, no masses, no lymphadenopathy, thyroid nonpalpable.  Skin: Warm and dry, no rashes. Cardiac: Regular rate and rhythm, no murmurs rubs or gallops, no lower extremity edema.  Respiratory: Clear  to auscultation bilaterally. Not using accessory muscles, speaking in full sentences. Right ankle: No visible erythema or swelling. Range of motion is full in all directions. Strength is 5/5 in all directions. Stable lateral and medial ligaments; squeeze test and kleiger test unremarkable; Talar dome nontender; No pain at base of 5th MT; No tenderness over cuboid; No tenderness over N spot or navicular prominence No tenderness on posterior aspects of lateral and medial malleolus No sign of peroneal tendon subluxations; Negative tarsal tunnel tinel's Able to walk 4 steps.  Impression and Recommendations:    Right ankle pain Complete tears of the ATFL, CFL, longitudinal split tear in the peroneus brevis. We did a cast for 3 weeks, boot for 3 weeks,  and now he is in an ASO. 2 sessions of physical therapy, and overall 70% better.  Pain is mostly anterior and posterior ankle, and no longer over the lateral ankle. I would like him to do at least 6 weeks of formal physical therapy, we will consider repeat MRI afterwards.  ___________________________________________ [name] [name], M.D., ABFM., CAQSM. Primary Care and [place] Instructor of [employer] [facility] of Medicine

## 2018-02-22 NOTE — Assessment & Plan Note (Signed)
Complete tears of the ATFL, CFL, longitudinal split tear in the peroneus brevis. We did a cast for 3 weeks, boot for 3 weeks, and now he is in an ASO. 2 sessions of physical therapy, and overall 70% better.  Pain is mostly anterior and posterior ankle, and no longer over the lateral ankle. I would like him to do at least 6 weeks of formal physical therapy, we will consider repeat MRI afterwards.

## 2018-02-23 ENCOUNTER — Other Ambulatory Visit: Payer: Self-pay | Admitting: Osteopathic Medicine

## 2018-02-23 ENCOUNTER — Encounter: Payer: Self-pay | Admitting: Physical Therapy

## 2018-02-23 ENCOUNTER — Ambulatory Visit: Payer: BC Managed Care – PPO | Admitting: Physical Therapy

## 2018-02-23 DIAGNOSIS — G4700 Insomnia, unspecified: Secondary | ICD-10-CM

## 2018-02-23 DIAGNOSIS — M6281 Muscle weakness (generalized): Secondary | ICD-10-CM | POA: Diagnosis not present

## 2018-02-23 DIAGNOSIS — M25471 Effusion, right ankle: Secondary | ICD-10-CM | POA: Diagnosis not present

## 2018-02-23 DIAGNOSIS — R2689 Other abnormalities of gait and mobility: Secondary | ICD-10-CM

## 2018-02-23 DIAGNOSIS — M25671 Stiffness of right ankle, not elsewhere classified: Secondary | ICD-10-CM

## 2018-02-23 MED ORDER — ZOLPIDEM TARTRATE 10 MG PO TABS: 5.0000 mg | ORAL_TABLET | Freq: Every evening | ORAL | 1 refills | Status: DC | PRN

## 2018-02-23 NOTE — Therapy (Signed)
[facility] [address] [place], [place], [address] Phone: [phone]   Fax:  [phone]  Physical Therapy Treatment  Patient Details  Name: Raymond Craig MRN: [id number] Date of Birth: 11/07/1960 Referring Provider: Dr T   Encounter Date: 02/23/2018  PT End of Session - 02/23/18 1029    Visit Number  3    Number of Visits  6    Date for PT Re-Evaluation  03/24/18    PT Start Time  1029    PT Stop Time  1120    PT Time Calculation (min)  51 min    Activity Tolerance  Patient tolerated treatment well       Past Medical History:  Diagnosis Date  . BPH (benign prostatic hypertrophy) with urinary retention 04/18/2013  . Erectile dysfunction 04/18/2013  . Family history of pancreatic cancer 04/18/2013  . Hyperlipidemia 04/18/2013  . Prostatitis     Past Surgical History:  Procedure Laterality Date  . COLONOSCOPY    . lipoma removal  2014   left chest    There were no vitals filed for this visit.  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     Currently in Pain?  Yes    Pain Score  3     Pain Location  Ankle    Pain Orientation  Right;Anterior    Pain Descriptors / Indicators  Aching    Pain Type  Acute pain    Pain Onset  More than a month ago    Pain Frequency  Constant    Aggravating Factors   walking, being up on the foot    Pain Relieving Factors  rest and ice         [facility] PT Assessment - 02/23/18 0001      Assessment   Medical Diagnosis  Rt ankle - torn ATFL, CFL, split peroneal brevis    Referring Provider  Dr T      Observation/Other Assessments-Edema    Edema  Circumferential      Circumferential Edema   Circumferential - Right  26.9    Circumferential - Left   26.2      AROM   Right Ankle  Dorsiflexion  12    Left Ankle Dorsiflexion  10      PROM   Right Ankle Dorsiflexion  17                   [facility] Adult PT Treatment/Exercise - 02/23/18 0001      Modalities   Modalities  Vasopneumatic      Vasopneumatic   Number Minutes Vasopneumatic   15 minutes    Vasopnuematic Location   Knee    Vasopneumatic Pressure  Medium    Vasopneumatic Temperature   3*      Ankle Exercises: Aerobic   Stationary Bike  L2x5'      Ankle Exercises: Stretches   Gastroc Stretch  2 reps 45 sec on prostretch      Ankle Exercises: Standing   Other Standing Ankle Exercises  tandem standing, changing front foot    Other Standing Ankle Exercises  heel walking/ toe walking      Ankle Exercises: Seated   Other Seated Ankle Exercises  seated foot intrinsic   exercise per handout             PT Education - 02/23/18 1045    Education Details  HEP    Person(s) Educated  Patient    Methods  Explanation;Demonstration;Handout    Comprehension  Returned demonstration;Verbalized understanding       PT Short Term Goals - 02/23/18 1102      PT SHORT TERM GOAL #1   Title  re-assess ROM/strength/proprioception Rt ankle ( 02/17/18)    Status  Achieved      PT SHORT TERM GOAL #2   Status  Achieved        PT Long Term Goals - 02/23/18 1102      PT LONG TERM GOAL #1   Title  I with advanced HEP to include return to swimming and walking program ( 03/24/18)     Status  On-going      PT LONG TERM GOAL #2   Title  demo Rt ankle/knee strength WNL to allow return to PLOF with walking ( 03/24/18)     Status  On-going      PT LONG TERM GOAL #3   Title  ambulate on even surfaces and up/down stairs without brace and normal gait pattern ( 03/24/18)     Status  On-going      PT LONG TERM GOAL #4   Title  improve FOTO =/< 37% limited ( 03/24/18)     Status  On-going      PT LONG TERM GOAL #5   Title  report no more than 2/10 pain after being up on his leg for multiple hours ( 03/24/18)      Status  Achieved            Plan - 02/23/18 1103    Clinical Impression Statement  Raymond Craig continues to make improvements, Rt ankle ROM is WNL however his left DF has decreased. Recommend pt work on stretching this one also. He has met more goals.  Proprioception and strength are still impaired and he would benefit from continued tx to address these. He is going to try swimming some laps before his next visit.     Rehab Potential  Excellent    PT Frequency  1x / week    PT Duration  8 weeks    PT Treatment/Interventions  Gait training;Neuromuscular re-education;Dry needling;Manual techniques;Moist Heat;Ultrasound;Therapeutic activities;Patient/family education;Taping;Vasopneumatic Device;Therapeutic exercise;Cryotherapy;Electrical Stimulation;Passive range of motion    PT Next Visit Plan  cont to progress HEP and proprioception as tolerates,        Patient will benefit from skilled therapeutic intervention in order to improve the following deficits and impairments:  Abnormal gait, Pain, Increased muscle spasms, Decreased range of motion, Decreased strength, Increased edema, Difficulty walking, Decreased balance  Visit Diagnosis: Stiffness of right ankle, not elsewhere classified  Swelling of ankle, right  Muscle weakness (generalized)  Other abnormalities of gait and mobility     Problem List Patient Active Problem List   Diagnosis Date Noted  . Right ankle pain 12/01/2017  . Complicated UTI (urinary tract infection) 09/15/2016  . Insomnia 11/26/2015  . Hyperglycemia 11/22/2014  . Left nephrolithiasis 02/26/2014  . History of colonic polyps 06/30/2013  . Hyperlipidemia 04/18/2013  . Erectile dysfunction 04/18/2013  . Benign prostatic hyperplasia with urinary retention 04/18/2013  . Family history of pancreatic cancer 04/18/2013    Susan Shaver PT  02/23/2018, 11:07 AM  [facility] [address] [place],  [place], [address] Phone:   [phone]   Fax:  [phone]  Name: Raymond Craig MRN: [id number] Date of Birth: [birth year]

## 2018-02-25 NOTE — Progress Notes (Signed)
Paper RX written by Dr T since issues with Dr Redgie Grayer DEA.... Faxed hardcopy to [facility]. Pt aware

## 2018-02-28 NOTE — Telephone Encounter (Signed)
Hard copy faxed on Friday 02-25-18

## 2018-03-09 ENCOUNTER — Encounter: Payer: Self-pay | Admitting: Physical Therapy

## 2018-03-09 ENCOUNTER — Ambulatory Visit: Payer: BC Managed Care – PPO | Admitting: Physical Therapy

## 2018-03-09 DIAGNOSIS — M6281 Muscle weakness (generalized): Secondary | ICD-10-CM | POA: Diagnosis not present

## 2018-03-09 DIAGNOSIS — M25471 Effusion, right ankle: Secondary | ICD-10-CM

## 2018-03-09 DIAGNOSIS — R2689 Other abnormalities of gait and mobility: Secondary | ICD-10-CM

## 2018-03-09 DIAGNOSIS — M25671 Stiffness of right ankle, not elsewhere classified: Secondary | ICD-10-CM

## 2018-03-09 NOTE — Therapy (Signed)
[place], [place], [address] Phone: [phone]   Fax:  [phone]  Physical Therapy Treatment  Patient Details  Name: Raymond Craig MRN: [id number] Date of Birth: 07/06/1961 Referring Provider: Dr. Darene Lamer   Encounter Date: 03/09/2018  PT End of Session - 03/09/18 1118    Visit Number  4    Number of Visits  6    Date for PT Re-Evaluation  03/24/18    PT Start Time  1020    PT Stop Time  1120    PT Time Calculation (min)  60 min    Activity Tolerance  Patient tolerated treatment well    Behavior During Therapy  [facility] for tasks assessed/performed       Past Medical History:  Diagnosis Date  . BPH (benign prostatic hypertrophy) with urinary retention 04/18/2013  . Erectile dysfunction 04/18/2013  . Family history of pancreatic cancer 04/18/2013  . Hyperlipidemia 04/18/2013  . Prostatitis     Past Surgical History:  Procedure Laterality Date  . COLONOSCOPY    . lipoma removal  2014   left chest    There were no vitals filed for this visit.  Subjective Assessment - 03/09/18 1031    Subjective  Pt relays ankle is doing ok, he relays 4/10 pain overall, and that he has been able to return to swimming    How long can you walk comfortably?  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     Currently in Pain?  Yes    Pain Score  4     Pain Location  Ankle    Pain Orientation  Right;Anterior;Posterior    Pain Descriptors / Indicators  Aching    Pain Type  Acute pain    Pain Onset  More than a month ago    Pain Frequency  Intermittent    Aggravating Factors   prolonged weight bearing    Pain Relieving Factors  rest, ice         [facility] PT Assessment - 03/09/18 1048      Assessment   Medical Diagnosis  Rt ankle - torn ATFL, CFL, split peroneal brevis     Referring Provider  Dr. Darene Lamer    Next MD Visit  04/05/18      Functional Tests   Functional tests  Single leg stance      Single Leg Stance   Comments  Rt 21 sec      ROM / Strength   AROM / PROM / Strength  AROM;PROM;Strength      AROM   AROM Assessment Site  Ankle    Right/Left Ankle  Right    Right Ankle Dorsiflexion  10    Right Ankle Plantar Flexion  -- WNL    Right Ankle Inversion  -- WNL    Right Ankle Eversion  15    Left Ankle Dorsiflexion  10      PROM   Right/Left Ankle  Right    Right Ankle Dorsiflexion  12      Strength   Right/Left Ankle  Right    Right Ankle Dorsiflexion  5/5    Right Ankle Plantar Flexion  4+/5    Right Ankle Inversion  4+/5    Right Ankle Eversion  4+/5                   [facility] Adult PT Treatment/Exercise - 03/09/18 0001      Modalities   Modalities  Vasopneumatic      Vasopneumatic   Number Minutes Vasopneumatic   15 minutes    Vasopnuematic Location   Knee    Vasopneumatic Pressure  Medium    Vasopneumatic Temperature   34      Manual Therapy   Manual Therapy  Joint mobilization;Passive ROM;Soft tissue mobilization    Joint Mobilization  gentle jt mobs all planes for ROM    Soft tissue mobilization  STM/IASTM ant and post ankle    Passive ROM  all planes for ROM      Ankle Exercises: Aerobic   Stationary Bike  L5X5 min      Ankle Exercises: Stretches   Soleus Stretch  30 seconds;2 reps    Gastroc Stretch  2 reps;30 seconds      Ankle Exercises: Standing   SLS  Rt 30 sec X 3 with intermitt UE support on foam    Heel Raises  20 reps;Both on foam    Other Standing Ankle Exercises  step down off low step 2X10    Other Standing Ankle Exercises  heel walking/ toe walking      Ankle Exercises: Supine   T-Band  green 4 way X 20             PT Education - 03/09/18 1113    Education Details  new exercises, need to stretch more    Person(s) Educated  Patient    Methods  Explanation;Demonstration;Verbal cues     Comprehension  Verbalized understanding;Returned demonstration       PT Short Term Goals - 03/09/18 1121      PT SHORT TERM GOAL #1   Title  re-assess ROM/strength/proprioception Rt ankle ( 02/17/18)    Time  3    Period  Weeks    Status  Achieved      PT SHORT TERM GOAL #2   Title  demo Rt ankle ROM WNL ( 02/17/18)     Period  Weeks    Status  Partially Met      PT SHORT TERM GOAL #3   Title  I with initial HEP ( 02/17/18)     Time  3    Period  Weeks    Status  Achieved      PT SHORT TERM GOAL #4   Title  circumfrential measurements for ankle edema within 0.50 cm of each other ( 02/17/18)     Time  3    Period  Weeks    Status  On-going        PT Long Term Goals - 03/09/18 1121      PT LONG TERM GOAL #1   Title  I with advanced HEP to include return to swimming and walking program ( 03/24/18)     Time  8    Period  Weeks    Status  Partially Met      PT LONG TERM GOAL #2   Title  demo Rt ankle/knee strength WNL to allow return to PLOF with walking ( 03/24/18)     Time  8    Period  Weeks    Status  Partially Met      PT LONG TERM GOAL #3   Title  ambulate on even surfaces and up/down stairs without brace and  normal gait pattern ( 03/24/18)     Time  8    Period  Weeks    Status  On-going      PT LONG TERM GOAL #4   Title  improve FOTO =/< 37% limited ( 03/24/18)     Time  8    Period  Weeks    Status  On-going      PT LONG TERM GOAL #5   Title  report no more than 2/10 pain after being up on his leg for multiple hours ( 03/24/18)     Time  8    Period  Weeks    Status  Partially Met            Plan - 03/09/18 1051    Clinical Impression Statement  Pt is making good overall progress with activity tolerance and has increased his strength but has lost some ROM and was encouraged to stretch more at home. He was progressed with strengthening and proprioception adding in foam surface. He will continue to benefit from PT.    Rehab Potential  Excellent    PT  Frequency  1x / week    PT Duration  8 weeks    PT Treatment/Interventions  Gait training;Neuromuscular re-education;Dry needling;Manual techniques;Moist Heat;Ultrasound;Therapeutic activities;Patient/family education;Taping;Vasopneumatic Device;Therapeutic exercise;Cryotherapy;Electrical Stimulation;Passive range of motion    PT Next Visit Plan  cont to progress HEP, ROM and proprioception as tolerated     Consulted and Agree with Plan of Care  Patient       Patient will benefit from skilled therapeutic intervention in order to improve the following deficits and impairments:  Abnormal gait, Pain, Increased muscle spasms, Decreased range of motion, Decreased strength, Increased edema, Difficulty walking, Decreased balance  Visit Diagnosis: Stiffness of right ankle, not elsewhere classified  Swelling of ankle, right  Muscle weakness (generalized)  Other abnormalities of gait and mobility     Problem List Patient Active Problem List   Diagnosis Date Noted  . Right ankle pain 12/01/2017  . Complicated UTI (urinary tract infection) 09/15/2016  . Insomnia 11/26/2015  . Hyperglycemia 11/22/2014  . Left nephrolithiasis 02/26/2014  . History of colonic polyps 06/30/2013  . Hyperlipidemia 04/18/2013  . Erectile dysfunction 04/18/2013  . Benign prostatic hyperplasia with urinary retention 04/18/2013  . Family history of pancreatic cancer 04/18/2013    Debbe Odea, PT, DPT  03/09/2018, 11:23 AM  [facility] [place], [place], [address] Phone: [phone]   Fax:  [phone]  Name: Raymond Craig MRN: [id number] Date of Birth: 1961-03-18

## 2018-03-09 NOTE — Patient Instructions (Signed)
Step down exercises for DF ROM off low step or book, 2 sets of 10

## 2018-03-22 ENCOUNTER — Ambulatory Visit: Payer: BC Managed Care – PPO | Admitting: Physical Therapy

## 2018-03-22 DIAGNOSIS — M25471 Effusion, right ankle: Secondary | ICD-10-CM | POA: Diagnosis not present

## 2018-03-22 DIAGNOSIS — M25671 Stiffness of right ankle, not elsewhere classified: Secondary | ICD-10-CM | POA: Diagnosis not present

## 2018-03-22 NOTE — Therapy (Addendum)
[place], [place], [address] Phone: [phone]   Fax:  [phone]  Physical Therapy Treatment/Discharge  Patient Details  Name: Raymond Craig MRN: [id number] Date of Birth: 05-30-1961 Referring Provider: Dr Darene Lamer.   Encounter Date: 03/22/2018  PT End of Session - 03/22/18 1056    Visit Number  5    Number of Visits  6    Date for PT Re-Evaluation  03/24/18    PT Start Time  4967    PT Stop Time  1110    PT Time Calculation (min)  55 min    Activity Tolerance  Patient tolerated treatment well    Behavior During Therapy  [facility] for tasks assessed/performed       Past Medical History:  Diagnosis Date  . BPH (benign prostatic hypertrophy) with urinary retention 04/18/2013  . Erectile dysfunction 04/18/2013  . Family history of pancreatic cancer 04/18/2013  . Hyperlipidemia 04/18/2013  . Prostatitis     Past Surgical History:  Procedure Laterality Date  . COLONOSCOPY    . lipoma removal  2014   left chest    There were no vitals filed for this visit.  Subjective Assessment - 03/22/18 1024    Subjective  Pt relays his ankle is doing a little better, he can now do anything he needs to but he still has pain and has to rest it.    Currently in Pain?  Yes    Pain Score  1     Pain Location  Ankle    Pain Orientation  Right    Pain Descriptors / Indicators  Aching    Pain Type  Acute pain    Pain Onset  More than a month ago    Pain Frequency  Intermittent         [facility] PT Assessment - 03/22/18 0001      Assessment   Medical Diagnosis  Rt ankle - torn ATFL, CFL, split peroneal brevis    Referring Provider  Dr T.    Next MD Visit  04/05/18      Observation/Other Assessments   Focus on Therapeutic Outcomes (FOTO)   35%      Functional Tests   Functional tests  Single leg stance      Single Leg Stance   Comments  Rt 25 sec      ROM / Strength   AROM / PROM / Strength  AROM;Strength      AROM   Overall AROM Comments  WFL    AROM Assessment Site  Ankle    Right/Left Ankle  Right      Strength   Right/Left Ankle  Right    Right Ankle Dorsiflexion  5/5    Right Ankle Plantar Flexion  5/5    Right Ankle Inversion  5/5    Right Ankle Eversion  5/5                   [facility] Adult PT Treatment/Exercise - 03/22/18 0001      Modalities   Modalities  Vasopneumatic      Vasopneumatic   Number Minutes Vasopneumatic   15 minutes    Vasopnuematic Location   Knee    Vasopneumatic Pressure  Medium    Vasopneumatic Temperature   34      Manual Therapy   Manual Therapy  Joint mobilization;Passive ROM;Soft tissue mobilization    Joint Mobilization  Jt mobs DF and distraction mobs grade 2-3  Passive ROM  all planes for ROM      Ankle Exercises: Stretches   Soleus Stretch  30 seconds;2 reps    Gastroc Stretch  2 reps;30 seconds      Ankle Exercises: Aerobic   Stationary Bike  L5X5 min      Ankle Exercises: Standing   Vector Stance  5 reps    SLS  Rt 30 sec X 3 with intermitt UE support on foam    Heel Raises  20 reps;Both    Braiding (Round Trip)  tandem walk     Other Standing Ankle Exercises  step down off low step 2X10    Other Standing Ankle Exercises  heel walking/ toe walking      Ankle Exercises: Supine   T-Band  blue 4 way X 20               PT Short Term Goals - 03/09/18 1121      PT SHORT TERM GOAL #1   Title  re-assess ROM/strength/proprioception Rt ankle ( 02/17/18)    Time  3    Period  Weeks    Status  Achieved      PT SHORT TERM GOAL #2   Title  demo Rt ankle ROM WNL ( 02/17/18)     Period  Weeks    Status  Partially Met      PT SHORT TERM GOAL #3   Title  I with initial HEP ( 02/17/18)     Time  3    Period  Weeks    Status  Achieved      PT SHORT TERM GOAL #4   Title  circumfrential measurements for ankle edema within 0.50 cm of each other ( 02/17/18)     Time  3    Period  Weeks    Status  On-going        PT  Long Term Goals - 03/22/18 1036      PT LONG TERM GOAL #1   Title  I with advanced HEP to include return to swimming and walking program ( 03/24/18)     Time  8    Period  Weeks    Status  Achieved      PT LONG TERM GOAL #2   Title  demo Rt ankle/knee strength WNL to allow return to PLOF with walking ( 03/24/18)     Time  8    Period  Weeks    Status  Achieved      PT LONG TERM GOAL #3   Title  ambulate on even surfaces and up/down stairs without brace and normal gait pattern ( 03/24/18)     Time  8    Period  Weeks    Status  Partially Met      PT LONG TERM GOAL #4   Title  improve FOTO =/< 37% limited ( 03/24/18)     Time  8    Period  Weeks    Status  Achieved      PT LONG TERM GOAL #5   Title  report no more than 2/10 pain after being up on his leg for multiple hours ( 03/24/18)     Time  8    Period  Weeks    Status  Partially Met            Plan - 03/22/18 1057    Clinical Impression Statement  Pt continues to make good progress and now has met 3/5  PT goals. He now has normal ankle strength and only mild deficits in ROM and stabilty and pain. He plans to come for one more session and then he will return to MD for opinion on he needs to continue PT. PT feels he will likely be ready for D/C next visit at this time if he meets his goals.    Rehab Potential  Excellent    PT Frequency  1x / week    PT Duration  8 weeks    PT Treatment/Interventions  Gait training;Neuromuscular re-education;Dry needling;Manual techniques;Moist Heat;Ultrasound;Therapeutic activities;Patient/family education;Taping;Vasopneumatic Device;Therapeutic exercise;Cryotherapy;Electrical Stimulation;Passive range of motion    PT Next Visit Plan  possible D/C next visit?    Consulted and Agree with Plan of Care  Patient       Patient will benefit from skilled therapeutic intervention in order to improve the following deficits and impairments:  Abnormal gait, Pain, Increased muscle spasms, Decreased range  of motion, Decreased strength, Increased edema, Difficulty walking, Decreased balance  Visit Diagnosis: Stiffness of right ankle, not elsewhere classified  Swelling of ankle, right     Problem List Patient Active Problem List   Diagnosis Date Noted  . Right ankle pain 12/01/2017  . Complicated UTI (urinary tract infection) 09/15/2016  . Insomnia 11/26/2015  . Hyperglycemia 11/22/2014  . Left nephrolithiasis 02/26/2014  . History of colonic polyps 06/30/2013  . Hyperlipidemia 04/18/2013  . Erectile dysfunction 04/18/2013  . Benign prostatic hyperplasia with urinary retention 04/18/2013  . Family history of pancreatic cancer 04/18/2013    Debbe Odea, PT, DPT 03/22/2018, 11:01 AM  [facility] [place], [place], [address] Phone: [phone]   Fax:  [phone]  Name: Raymond Craig MRN: [id number] Date of Birth: October 14, 1960     PHYSICAL THERAPY DISCHARGE SUMMARY  Visits from Start of Care: 5  Current functional level related to goals / functional outcomes: See above   Remaining deficits: See above   Education / Equipment: HEP  Plan: Patient agrees to discharge.  Patient goals were partially met. Patient is being discharged due to not returning since the last visit.  ?????     Laureen Abrahams, PT, DPT 04/18/18 3:51 PM  [facility] [place], [place] [address]  [phone] (office) [phone] (fax)

## 2018-04-05 ENCOUNTER — Encounter: Payer: Self-pay | Admitting: Sports Medicine

## 2018-04-05 ENCOUNTER — Encounter: Payer: BC Managed Care – PPO | Admitting: Physical Therapy

## 2018-04-05 ENCOUNTER — Ambulatory Visit: Payer: BC Managed Care – PPO | Admitting: Sports Medicine

## 2018-04-05 DIAGNOSIS — G8929 Other chronic pain: Secondary | ICD-10-CM | POA: Diagnosis not present

## 2018-04-05 DIAGNOSIS — M25571 Pain in right ankle and joints of right foot: Secondary | ICD-10-CM | POA: Diagnosis not present

## 2018-04-05 NOTE — Assessment & Plan Note (Signed)
Complete tears of the ATFL, CFL, longitudinal split tear in the peroneus brevis. We did a cast for 3 weeks, boot for 3 weeks, doing well in an ASO, he has finished many many physical therapy sessions. Mostly pain over the anterior ankle joint, no pain over the lateral ankle. We are going to do an ankle joint injection to see if we can knock this the rest of the way out, return in 1 month.

## 2018-04-05 NOTE — Progress Notes (Signed)
Subjective:    CC: Recheck ankle  HPI: This is a pleasant 57 year old male, we have been treating him for months for severe ankle injury that consisted of multi-ligamentous tears as well as a split tear in the peroneus brevis.  He has done extremely well, is for the most part pain-free but still has mild discomfort over the anterior ankle and mild stiffness.  Symptoms are mild, persistent.  Localized without radiation.  I reviewed the past medical history, family history, social history, surgical history, and allergies today and no changes were needed.  Please see the problem list section below in epic for further details.  Past Medical History: Past Medical History:  Diagnosis Date  . BPH (benign prostatic hypertrophy) with urinary retention 04/18/2013  . Erectile dysfunction 04/18/2013  . Family history of pancreatic cancer 04/18/2013  . Hyperlipidemia 04/18/2013  . Prostatitis    Past Surgical History: Past Surgical History:  Procedure Laterality Date  . COLONOSCOPY    . lipoma removal  2014   left chest   Social History: Social History   Socioeconomic History  . Marital status: Married    Spouse name: Not on file  . Number of children: Not on file  . Years of education: Not on file  . Highest education level: Not on file  Occupational History  . Not on file  Social Needs  . Financial resource strain: Not on file  . Food insecurity:    Worry: Not on file    Inability: Not on file  . Transportation needs:    Medical: Not on file    Non-medical: Not on file  Tobacco Use  . Smoking status: Never Smoker  . Smokeless tobacco: Never Used  Substance and Sexual Activity  . Alcohol use: Yes    Alcohol/week: 4.0 standard drinks    Types: 4 Cans of beer per week  . Drug use: No  . Sexual activity: Not on file  Lifestyle  . Physical activity:    Days per week: Not on file    Minutes per session: Not on file  . Stress: Not on file  Relationships  . Social connections:      Talks on phone: Not on file    Gets together: Not on file    Attends religious service: Not on file    Active member of club or organization: Not on file    Attends meetings of clubs or organizations: Not on file    Relationship status: Not on file  Other Topics Concern  . Not on file  Social History Narrative  . Not on file   Family History: Family History  Problem Relation Age of Onset  . Breast cancer Mother   . Pancreatic cancer Father   . Pancreatic cancer Paternal Uncle   . Colon cancer Neg Hx   . Colon polyps Neg Hx   . Esophageal cancer Neg Hx   . Rectal cancer Neg Hx   . Stomach cancer Neg Hx    Allergies: No Known Allergies Medications: See med rec.  Review of Systems: No fevers, chills, night sweats, weight loss, chest pain, or shortness of breath.   Objective:    General: Well Developed, well nourished, and in no acute distress.  Neuro: Alert and oriented x3, extra-ocular muscles intact, sensation grossly intact.  HEENT: Normocephalic, atraumatic, pupils equal round reactive to light, neck supple, no masses, no lymphadenopathy, thyroid nonpalpable.  Skin: Warm and dry, no rashes. Cardiac: Regular rate and rhythm, no murmurs rubs  or gallops, no lower extremity edema.  Respiratory: Clear to auscultation bilaterally. Not using accessory muscles, speaking in full sentences. Right ankle: No visible erythema or swelling. Range of motion is full in all directions. Strength is 5/5 in all directions. Stable lateral and medial ligaments; squeeze test and kleiger test unremarkable; Talar dome nontender; No pain at base of 5th MT; No tenderness over cuboid; No tenderness over N spot or navicular prominence No tenderness on posterior aspects of lateral and medial malleolus No sign of peroneal tendon subluxations; Negative tarsal tunnel tinel's Able to walk 4 steps.  Procedure: Real-time Ultrasound Guided Injection of right ankle Device: GE Logiq E  Verbal  informed consent obtained.  Time-out conducted.  Noted no overlying erythema, induration, or other signs of local infection.  Skin prepped in a sterile fashion.  Local anesthesia: Topical Ethyl chloride.  With sterile technique and under real time ultrasound guidance: 1 cc Kenalog 40, 1 cc lidocaine, 1 cc bupivacaine injected easily. Completed without difficulty  Pain immediately resolved suggesting accurate placement of the medication.  Advised to call if fevers/chills, erythema, induration, drainage, or persistent bleeding.  Images permanently stored and available for review in the ultrasound unit.  Impression: Technically successful ultrasound guided injection.  Impression and Recommendations:    Right ankle pain Complete tears of the ATFL, CFL, longitudinal split tear in the peroneus brevis. We did a cast for 3 weeks, boot for 3 weeks, doing well in an ASO, he has finished many many physical therapy sessions. Mostly pain over the anterior ankle joint, no pain over the lateral ankle. We are going to do an ankle joint injection to see if we can knock this the rest of the way out, return in 1 month. ___________________________________________ [name] [name], M.D., ABFM., CAQSM. Primary Care and [place] Instructor of [place] of [facility] of Medicine

## 2018-05-10 ENCOUNTER — Ambulatory Visit: Payer: BC Managed Care – PPO | Admitting: Sports Medicine

## 2018-05-17 ENCOUNTER — Encounter: Payer: Self-pay | Admitting: Sports Medicine

## 2018-05-17 ENCOUNTER — Ambulatory Visit (INDEPENDENT_AMBULATORY_CARE_PROVIDER_SITE_OTHER): Payer: BC Managed Care – PPO | Admitting: Sports Medicine

## 2018-05-17 DIAGNOSIS — G8929 Other chronic pain: Secondary | ICD-10-CM | POA: Diagnosis not present

## 2018-05-17 DIAGNOSIS — M25571 Pain in right ankle and joints of right foot: Secondary | ICD-10-CM | POA: Diagnosis not present

## 2018-05-17 NOTE — Progress Notes (Signed)
Subjective:    CC: Recheck ankle  HPI: Raymond Craig is a pleasant 57 year old male, back in March he had an inversion injury of his right ankle, really never sought medical attention, then a couple of months ago I saw him, ultimately we did an MRI that showed complete tears of the CFL, ATFL and a longitudinal split tear of the peroneus brevis.  We placed him in a cast for 3 weeks, a boot for an additional 3 weeks and placed him into formal physical therapy.  He returns today after we did a tibiotalar joint injection with ultrasound guidance at the last visit as he was still having a bit of discomfort.  Overall he is doing extremely well, may be 90% better but still has only slight discomfort.  He feels as though he can live with it and it does not interfere with anything he does day-to-day.  He does wear a low-profile ASO for support.  I reviewed the past medical history, family history, social history, surgical history, and allergies today and no changes were needed.  Please see the problem list section below in epic for further details.  Past Medical History: Past Medical History:  Diagnosis Date  . BPH (benign prostatic hypertrophy) with urinary retention 04/18/2013  . Erectile dysfunction 04/18/2013  . Family history of pancreatic cancer 04/18/2013  . Hyperlipidemia 04/18/2013  . Prostatitis    Past Surgical History: Past Surgical History:  Procedure Laterality Date  . COLONOSCOPY    . lipoma removal  2014   left chest   Social History: Social History   Socioeconomic History  . Marital status: Married    Spouse name: Not on file  . Number of children: Not on file  . Years of education: Not on file  . Highest education level: Not on file  Occupational History  . Not on file  Social Needs  . Financial resource strain: Not on file  . Food insecurity:    Worry: Not on file    Inability: Not on file  . Transportation needs:    Medical: Not on file    Non-medical: Not on file  Tobacco  Use  . Smoking status: Never Smoker  . Smokeless tobacco: Never Used  Substance and Sexual Activity  . Alcohol use: Yes    Alcohol/week: 4.0 standard drinks    Types: 4 Cans of beer per week  . Drug use: No  . Sexual activity: Not on file  Lifestyle  . Physical activity:    Days per week: Not on file    Minutes per session: Not on file  . Stress: Not on file  Relationships  . Social connections:    Talks on phone: Not on file    Gets together: Not on file    Attends religious service: Not on file    Active member of club or organization: Not on file    Attends meetings of clubs or organizations: Not on file    Relationship status: Not on file  Other Topics Concern  . Not on file  Social History Narrative  . Not on file   Family History: Family History  Problem Relation Age of Onset  . Breast cancer Mother   . Pancreatic cancer Father   . Pancreatic cancer Paternal Uncle   . Colon cancer Neg Hx   . Colon polyps Neg Hx   . Esophageal cancer Neg Hx   . Rectal cancer Neg Hx   . Stomach cancer Neg Hx    Allergies:  No Known Allergies Medications: See med rec.  Review of Systems: No fevers, chills, night sweats, weight loss, chest pain, or shortness of breath.   Objective:    General: Well Developed, well nourished, and in no acute distress.  Neuro: Alert and oriented x3, extra-ocular muscles intact, sensation grossly intact.  HEENT: Normocephalic, atraumatic, pupils equal round reactive to light, neck supple, no masses, no lymphadenopathy, thyroid nonpalpable.  Skin: Warm and dry, no rashes. Cardiac: Regular rate and rhythm, no murmurs rubs or gallops, no lower extremity edema.  Respiratory: Clear to auscultation bilaterally. Not using accessory muscles, speaking in full sentences. Right ankle: No visible erythema or swelling. Range of motion is full in all directions. Strength is 5/5 in all directions. Stable lateral and medial ligaments; squeeze test and kleiger  test unremarkable; Talar dome nontender; Only minimal tenderness over the lateral tibiotalar joint. No pain at base of 5th MT; No tenderness over cuboid; No tenderness over N spot or navicular prominence No tenderness on posterior aspects of lateral and medial malleolus No sign of peroneal tendon subluxations; Negative tarsal tunnel tinel's Able to walk 4 steps.  Impression and Recommendations:    Right ankle pain Ankle injury back in March, he did have complete tears of the ATFL, CFL, as well as a longitudinal split tear in the peroneus brevis. We did a cast for 3 weeks, boot for 3 weeks, he is currently doing well in an ASO, he has finished many, many sessions of physical therapy. At the last visit he was having some anterior ankle pain so we did a tibiotalar joint injection under ultrasound guidance, did not really have all that much relief. At this point his pain is almost completely gone, it is not limiting him, and he does feel as though he can live with it. I would like a second opinion from orthopedic foot and ankle surgery for return him loose. I have advised continued strengthening exercises on a daily basis at home for now.  ___________________________________________ [name] [name], M.D., ABFM., CAQSM. Primary Care and [place] Instructor of [place] of [facility] of Medicine

## 2018-05-17 NOTE — Assessment & Plan Note (Signed)
Ankle injury back in March, he did have complete tears of the ATFL, CFL, as well as a longitudinal split tear in the peroneus brevis. We did a cast for 3 weeks, boot for 3 weeks, he is currently doing well in an ASO, he has finished many, many sessions of physical therapy. At the last visit he was having some anterior ankle pain so we did a tibiotalar joint injection under ultrasound guidance, did not really have all that much relief. At this point his pain is almost completely gone, it is not limiting him, and he does feel as though he can live with it. I would like a second opinion from orthopedic foot and ankle surgery for return him loose. I have advised continued strengthening exercises on a daily basis at home for now.

## 2018-08-29 ENCOUNTER — Other Ambulatory Visit: Payer: Self-pay

## 2018-08-29 DIAGNOSIS — G4700 Insomnia, unspecified: Secondary | ICD-10-CM

## 2018-08-29 MED ORDER — ZOLPIDEM TARTRATE 10 MG PO TABS: 5.0000 mg | ORAL_TABLET | Freq: Every evening | ORAL | 1 refills | Status: DC | PRN

## 2018-08-29 NOTE — Telephone Encounter (Signed)
Refill sent Needs to see me - last visit 08/2017

## 2018-08-29 NOTE — Telephone Encounter (Signed)
Requesting RF on Ambien   Last RX sent 02/23/18 for #90 with 1 RF   RX pended, please send if appropriate

## 2018-08-30 ENCOUNTER — Other Ambulatory Visit: Payer: Self-pay | Admitting: Osteopathic Medicine

## 2018-08-30 DIAGNOSIS — G4700 Insomnia, unspecified: Secondary | ICD-10-CM

## 2018-08-30 NOTE — Telephone Encounter (Signed)
Pt advised, physical scheduled for 09/21/18

## 2018-08-31 ENCOUNTER — Other Ambulatory Visit: Payer: Self-pay

## 2018-08-31 DIAGNOSIS — G4700 Insomnia, unspecified: Secondary | ICD-10-CM

## 2018-08-31 MED ORDER — ZOLPIDEM TARTRATE 10 MG PO TABS: 5.0000 mg | ORAL_TABLET | Freq: Every evening | ORAL | 1 refills | Status: DC | PRN

## 2018-08-31 NOTE — Telephone Encounter (Signed)
Ambien did not go to the pharmacy. The prescription was printed. Please refill.

## 2018-09-01 NOTE — Telephone Encounter (Signed)
As per pharmacist - disregard med refill request. Duplicate order. Pt p/u zolpidem rx on 08/31/18.

## 2018-09-01 NOTE — Telephone Encounter (Signed)
This should have been sent already please call pharmacy to confirm

## 2018-09-01 NOTE — Telephone Encounter (Signed)
[facility] requesting med refill for zolpidem.  

## 2018-09-21 ENCOUNTER — Encounter: Payer: BC Managed Care – PPO | Admitting: Osteopathic Medicine

## 2018-10-11 ENCOUNTER — Telehealth: Payer: Self-pay | Admitting: Osteopathic Medicine

## 2018-10-11 ENCOUNTER — Encounter: Payer: BC Managed Care – PPO | Admitting: Osteopathic Medicine

## 2018-10-11 NOTE — Telephone Encounter (Signed)
Patient called at 8:53am stating he would not be able to make his appointment. Had been stuck in traffic for the last 20 minutes. Physical has been rescheduled. No further questions at this time.

## 2018-10-18 ENCOUNTER — Other Ambulatory Visit: Payer: Self-pay | Admitting: Osteopathic Medicine

## 2018-10-18 DIAGNOSIS — N529 Male erectile dysfunction, unspecified: Secondary | ICD-10-CM

## 2018-10-26 ENCOUNTER — Ambulatory Visit: Payer: BC Managed Care – PPO | Admitting: Osteopathic Medicine

## 2018-10-26 ENCOUNTER — Encounter: Payer: Self-pay | Admitting: Osteopathic Medicine

## 2018-10-26 VITALS — BP 157/75 | HR 63 | Temp 98.1°F | Wt 230.0 lb

## 2018-10-26 DIAGNOSIS — Z0000 Encounter for general adult medical examination without abnormal findings: Secondary | ICD-10-CM

## 2018-10-26 DIAGNOSIS — Z125 Encounter for screening for malignant neoplasm of prostate: Secondary | ICD-10-CM

## 2018-10-26 DIAGNOSIS — R030 Elevated blood-pressure reading, without diagnosis of hypertension: Secondary | ICD-10-CM | POA: Diagnosis not present

## 2018-10-26 NOTE — Progress Notes (Signed)
HPI: Raymond Craig is a 58 y.o. male who  has a past medical history of BPH (benign prostatic hypertrophy) with urinary retention (04/18/2013), Erectile dysfunction (04/18/2013), Family history of pancreatic cancer (04/18/2013), Hyperlipidemia (04/18/2013), and Prostatitis.  he presents to [facility], 10/26/18,  for chief complaint of: Annual physical     Patient here for annual physical / wellness exam.  See preventive care reviewed as below.  Recent labs reviewed in detail with the patient.   Additional concerns today include:  BP elevated today Reports increased stress at work  Swimming at [employer] most days for exercise.  Occasional EtOH   Past medical, surgical, social and family history reviewed:  Patient Active Problem List   Diagnosis Date Noted  . Elevated blood pressure reading in office without diagnosis of hypertension 10/26/2018  . Right ankle pain 12/01/2017  . Complicated UTI (urinary tract infection) 09/15/2016  . Insomnia 11/26/2015  . Hyperglycemia 11/22/2014  . Left nephrolithiasis 02/26/2014  . History of colonic polyps 06/30/2013  . Hyperlipidemia 04/18/2013  . Erectile dysfunction 04/18/2013  . Benign prostatic hyperplasia with urinary retention 04/18/2013  . Family history of pancreatic cancer 04/18/2013    Past Surgical History:  Procedure Laterality Date  . COLONOSCOPY    . lipoma removal  2014   left chest    Social History   Tobacco Use  . Smoking status: Never Smoker  . Smokeless tobacco: Never Used  Substance Use Topics  . Alcohol use: Yes    Alcohol/week: 4.0 standard drinks    Types: 4 Cans of beer per week    Family History  Problem Relation Age of Onset  . Breast cancer Mother   . Pancreatic cancer Father   . Pancreatic cancer Paternal Uncle   . Colon cancer Neg Hx   . Colon polyps Neg Hx   . Esophageal cancer Neg Hx   . Rectal cancer Neg Hx   . Stomach cancer Neg Hx      Current  medication list and allergy/intolerance information reviewed:    Current Outpatient Medications  Medication Sig Dispense Refill  . Tadalafil 2.5 MG TABS TAKE 1 TABLET BY MOUTH EVERY DAY 30 tablet 0  . zolpidem (AMBIEN) 10 MG tablet Take 0.5-1 tablets (5-10 mg total) by mouth at bedtime as needed. 90 tablet 1   Current Facility-Administered Medications  Medication Dose Route Frequency Provider Last Rate Last Dose  . 0.9 %  sodium chloride infusion  500 mL Intravenous Continuous Milus Banister, MD        No Known Allergies    Review of Systems:  Constitutional:  No  fever, no chills, No recent illness  HEENT: No  headache, no vision change, no hearing change, No sore throat, No  sinus pressure  Cardiac: No  chest pain, No  pressure, No palpitations, No  Orthopnea  Respiratory:  No  shortness of breath. No  Cough  Gastrointestinal: No  abdominal pain, No  nausea, No  vomiting,  No  blood in stool, No  diarrhea, No  constipation   Musculoskeletal: No new myalgia/arthralgia  Skin: No  Rash, No other wounds/concerning lesions  Genitourinary: No  incontinence, No  abnormal genital bleeding, No abnormal genital discharge  Hem/Onc: No  easy bruising/bleeding, No  abnormal lymph node  Endocrine: No cold intolerance,  No heat intolerance. No polyuria/polydipsia/polyphagia   Neurologic: No  weakness, No  dizziness, No  slurred speech/focal weakness/facial droop  Psychiatric: No  concerns with  depression, No  concerns with anxiety, No sleep problems, No mood problems  Exam:  BP (!) 157/75 (BP Location: Left Arm, Patient Position: Sitting, Cuff Size: Normal)   Pulse 63   Temp 98.1 F (36.7 C) (Oral)   Wt 230 lb (104.3 kg)   BMI 31.19 kg/m   Constitutional: VS see above. General Appearance: alert, well-developed, well-nourished, NAD  Eyes: Normal lids and conjunctive, non-icteric sclera  Ears, Nose, Mouth, Throat: MMM, Normal external inspection ears/nares/mouth/lips/gums. TM  normal bilaterally. Pharynx/tonsils no erythema, no exudate. Nasal mucosa normal.   Neck: No masses, trachea midline. No thyroid enlargement. No tenderness/mass appreciated. No lymphadenopathy  Respiratory: Normal respiratory effort. no wheeze, no rhonchi, no rales  Cardiovascular: S1/S2 normal, no murmur, no rub/gallop auscultated. RRR. No lower extremity edema.   Gastrointestinal: Nontender, no masses. No hepatomegaly, no splenomegaly. No hernia appreciated. Bowel sounds normal. Rectal exam deferred.   Musculoskeletal: Gait normal. No clubbing/cyanosis of digits.   Neurological: Normal balance/coordination. No tremor. No cranial nerve deficit on limited exam. Motor and sensation intact and symmetric. Cerebellar reflexes intact.   Skin: warm, dry, intact. No rash/ulcer. No concerning nevi or subq nodules on limited exam.    Psychiatric: Normal judgment/insight. Normal mood and affect. Oriented x3.      ASSESSMENT/PLAN: The primary encounter diagnosis was Annual physical exam. Diagnoses of Elevated blood pressure reading in office without diagnosis of hypertension and Prostate cancer screening were also pertinent to this visit.   Orders Placed This Encounter  Procedures  . CBC  . COMPLETE METABOLIC PANEL WITH GFR  . Lipid panel  . TSH  . PSA, Total with Reflex to PSA, Free     Patient Instructions  General Preventive Care  Most recent routine screening lipids/other labs: ordered today, please return for fasting blood work!   Everyone should have blood pressure checked once per year. Let's follow up on yours since it was above the 130/80 goal! Message or call us with BP numbers over the next 1-2 weeks.   Tobacco: don't!   Alcohol: responsible moderation is ok for most adults - if you have concerns about your alcohol intake, please talk to me!   Exercise: as tolerated to reduce risk of cardiovascular disease and diabetes. Strength training will also prevent osteoporosis.    Mental health: if need for mental health care (medicines, counseling, other), or concerns about moods, please let me know!   Sexual health: if need for STD testing, or if concerns with libido/pain problems, please let me know!   Advanced Directive: Living Will and/or Healthcare Power of Attorney recommended for all adults, regardless of age or health.  Vaccines  Flu vaccine: recommended for almost everyone, every fall.   Shingles vaccine: Shingrix completed!   Pneumonia vaccines: Prevnar and Pneumovax recommended after age 75, or sooner if certain medical conditions.  Tetanus booster: Tdap recommended every 10 years. Due 10/2024 per records, can confirm this with Dr Ardis Hughs' office 13- Cancer screenings   Colon cancer screening: recommended for everyone at age 45, due 01/2022  Prostate cancer screening: PSA blood test annually age 28-71  Lung cancer screening: not needed for non-smokers  Infection screenings . HIV, Gonorrhea/Chlamydia: screening as needed . Hepatitis C: recommended for anyone born 98-1963 . TB: certain at-risk populations, or depending on work requirements and/or travel history Other . Bone Density Test: recommended for men at age 30       Visit summary with medication list and pertinent instructions was printed for patient to review. All  questions at time of visit were answered - patient instructed to contact office with any additional concerns or updates. ER/RTC precautions were reviewed with the patient.    Please note: voice recognition software was used to produce this document, and typos may escape review. Please contact Dr. Sheppard Coil for any needed clarifications.     Follow-up plan: Return in about 1 year (around 10/26/2019) for [place], sooner if needed / depending on labs and blood pressures .

## 2018-10-26 NOTE — Patient Instructions (Signed)
General Preventive Care  Most recent routine screening lipids/other labs: ordered today, please return for fasting blood work!   Everyone should have blood pressure checked once per year. Let's follow up on yours since it was above the 130/80 goal! Message or call us with BP numbers over the next 1-2 weeks.   Tobacco: don't!   Alcohol: responsible moderation is ok for most adults - if you have concerns about your alcohol intake, please talk to me!   Exercise: as tolerated to reduce risk of cardiovascular disease and diabetes. Strength training will also prevent osteoporosis.   Mental health: if need for mental health care (medicines, counseling, other), or concerns about moods, please let me know!   Sexual health: if need for STD testing, or if concerns with libido/pain problems, please let me know!   Advanced Directive: Living Will and/or Healthcare Power of Attorney recommended for all adults, regardless of age or health.  Vaccines  Flu vaccine: recommended for almost everyone, every fall.   Shingles vaccine: Shingrix completed!   Pneumonia vaccines: Prevnar and Pneumovax recommended after age 58, or sooner if certain medical conditions.  Tetanus booster: Tdap recommended every 10 years. Due 10/2024 per records, can confirm this with Dr Ardis Hughs' office 82- Cancer screenings   Colon cancer screening: recommended for everyone at age 81, due 01/2022  Prostate cancer screening: PSA blood test annually age 78-71  Lung cancer screening: not needed for non-smokers  Infection screenings . HIV, Gonorrhea/Chlamydia: screening as needed . Hepatitis C: recommended for anyone born 27-1963 . TB: certain at-risk populations, or depending on work requirements and/or travel history Other . Bone Density Test: recommended for men at age 53

## 2018-11-09 ENCOUNTER — Other Ambulatory Visit: Payer: Self-pay | Admitting: Osteopathic Medicine

## 2018-11-09 DIAGNOSIS — N529 Male erectile dysfunction, unspecified: Secondary | ICD-10-CM

## 2018-11-09 MED ORDER — TADALAFIL 2.5 MG PO TABS: 1.0000 | ORAL_TABLET | Freq: Every day | ORAL | 0 refills | Status: DC

## 2018-11-18 ENCOUNTER — Other Ambulatory Visit: Payer: Self-pay | Admitting: Osteopathic Medicine

## 2018-11-18 DIAGNOSIS — N529 Male erectile dysfunction, unspecified: Secondary | ICD-10-CM

## 2018-12-08 ENCOUNTER — Encounter: Payer: Self-pay | Admitting: Osteopathic Medicine

## 2018-12-08 DIAGNOSIS — S86319A Strain of muscle(s) and tendon(s) of peroneal muscle group at lower leg level, unspecified leg, initial encounter: Secondary | ICD-10-CM | POA: Insufficient documentation

## 2019-01-20 ENCOUNTER — Other Ambulatory Visit: Payer: Self-pay | Admitting: Osteopathic Medicine

## 2019-01-20 DIAGNOSIS — N529 Male erectile dysfunction, unspecified: Secondary | ICD-10-CM

## 2019-01-23 ENCOUNTER — Other Ambulatory Visit: Payer: Self-pay | Admitting: Osteopathic Medicine

## 2019-01-23 DIAGNOSIS — N529 Male erectile dysfunction, unspecified: Secondary | ICD-10-CM

## 2019-01-23 MED ORDER — TADALAFIL 2.5 MG PO TABS: 1.0000 | ORAL_TABLET | Freq: Every day | ORAL | 1 refills | Status: DC

## 2019-02-27 ENCOUNTER — Telehealth: Payer: Self-pay

## 2019-02-27 DIAGNOSIS — G4700 Insomnia, unspecified: Secondary | ICD-10-CM

## 2019-02-27 MED ORDER — ZOLPIDEM TARTRATE 10 MG PO TABS: 5.0000 mg | ORAL_TABLET | Freq: Every evening | ORAL | 1 refills | Status: DC | PRN

## 2019-02-27 NOTE — Telephone Encounter (Signed)
Sent!

## 2019-02-27 NOTE — Telephone Encounter (Signed)
Pt has been updated regarding med refill sent to pharmacy. No other inquiries during call.

## 2019-02-27 NOTE — Telephone Encounter (Signed)
Pt called requesting med refill for zolpidem. As per pt, he has one pill available. [facility]

## 2019-03-19 ENCOUNTER — Other Ambulatory Visit: Payer: Self-pay | Admitting: Osteopathic Medicine

## 2019-03-19 DIAGNOSIS — N529 Male erectile dysfunction, unspecified: Secondary | ICD-10-CM

## 2019-05-20 ENCOUNTER — Other Ambulatory Visit: Payer: Self-pay | Admitting: Osteopathic Medicine

## 2019-05-20 DIAGNOSIS — N529 Male erectile dysfunction, unspecified: Secondary | ICD-10-CM

## 2019-07-03 ENCOUNTER — Telehealth: Payer: Self-pay

## 2019-07-03 NOTE — Telephone Encounter (Signed)
Patient called back- states he is mainly having diziness but really wants COVID test. Patient has been scheduled for virtual appointment with Dr T tomorrow and states he is going to go to [place] today to get COVID tested.

## 2019-07-03 NOTE — Telephone Encounter (Signed)
Raymond Craig called and left a message about being dizzy and not feeling well. He also mentioned being worried it may be COVID-19. I called and left him a message advising him to schedule a virtual visit. Also left the testing site information.

## 2019-07-04 ENCOUNTER — Ambulatory Visit (INDEPENDENT_AMBULATORY_CARE_PROVIDER_SITE_OTHER): Payer: BC Managed Care – PPO | Admitting: Sports Medicine

## 2019-07-04 DIAGNOSIS — H8110 Benign paroxysmal vertigo, unspecified ear: Secondary | ICD-10-CM | POA: Insufficient documentation

## 2019-07-04 DIAGNOSIS — H8113 Benign paroxysmal vertigo, bilateral: Secondary | ICD-10-CM

## 2019-07-04 MED ORDER — PREDNISONE 50 MG PO TABS: 50.0000 mg | ORAL_TABLET | Freq: Every day | ORAL | 0 refills | Status: DC

## 2019-07-04 NOTE — Progress Notes (Signed)
Virtual Visit via WebEx/MyChart   I connected with  Raymond Craig  on 07/04/19 via WebEx/MyChart/Doximity Video and verified that I am speaking with the correct person using two identifiers.   I discussed the limitations, risks, security and privacy concerns of performing an evaluation and management service by WebEx/MyChart/Doximity Video, including the higher likelihood of inaccurate diagnosis and treatment, and the availability of in person appointments.  We also discussed the likely need of an additional face to face encounter for complete and high quality delivery of care.  I also discussed with the patient that there may be a patient responsible charge related to this service. The patient expressed understanding and wishes to proceed.  Provider location is either at home or medical facility. Patient location is at their home, different from provider location. People involved in care of the patient during this telehealth encounter were myself, my nurse/medical assistant, and my front office/scheduling team member.  Subjective:    CC: Dizziness  HPI: This is a pleasant, healthy 58 year old male, he had a few episodes of vertiginous dizziness, worse when going from sitting to standing but not described as presyncopal, as well as when turning his head.  He has baseline tinnitus, no changes in his hearing.  No constitutional symptoms, no trauma.  I reviewed the past medical history, family history, social history, surgical history, and allergies today and no changes were needed.  Please see the problem list section below in epic for further details.  Past Medical History: Past Medical History:  Diagnosis Date  . BPH (benign prostatic hypertrophy) with urinary retention 04/18/2013  . Erectile dysfunction 04/18/2013  . Family history of pancreatic cancer 04/18/2013  . Hyperlipidemia 04/18/2013  . Prostatitis    Past Surgical History: Past Surgical History:  Procedure Laterality Date  .  COLONOSCOPY    . lipoma removal  2014   left chest   Social History: Social History   Socioeconomic History  . Marital status: Married    Spouse name: Not on file  . Number of children: Not on file  . Years of education: Not on file  . Highest education level: Not on file  Occupational History  . Not on file  Social Needs  . Financial resource strain: Not on file  . Food insecurity    Worry: Not on file    Inability: Not on file  . Transportation needs    Medical: Not on file    Non-medical: Not on file  Tobacco Use  . Smoking status: Never Smoker  . Smokeless tobacco: Never Used  Substance and Sexual Activity  . Alcohol use: Yes    Alcohol/week: 4.0 standard drinks    Types: 4 Cans of beer per week  . Drug use: No  . Sexual activity: Not on file  Lifestyle  . Physical activity    Days per week: Not on file    Minutes per session: Not on file  . Stress: Not on file  Relationships  . Social Herbalist on phone: Not on file    Gets together: Not on file    Attends religious service: Not on file    Active member of club or organization: Not on file    Attends meetings of clubs or organizations: Not on file    Relationship status: Not on file  Other Topics Concern  . Not on file  Social History Narrative  . Not on file   Family History: Family History  Problem Relation Age  of Onset  . Breast cancer Mother   . Pancreatic cancer Father   . Pancreatic cancer Paternal Uncle   . Colon cancer Neg Hx   . Colon polyps Neg Hx   . Esophageal cancer Neg Hx   . Rectal cancer Neg Hx   . Stomach cancer Neg Hx    Allergies: No Known Allergies Medications: See med rec.  Review of Systems: No fevers, chills, night sweats, weight loss, chest pain, or shortness of breath.   Objective:    General: Speaking full sentences, no audible heavy breathing.  Sounds alert and appropriately interactive.  Appears well.  Face symmetric.  Extraocular movements intact.   Pupils equal and round.  No nasal flaring or accessory muscle use visualized.  No other physical exam performed due to the non-physical nature of this visit.  Impression and Recommendations:    Benign positional vertigo Adding 5 days of prednisone, symptoms are resolving so we can hold off on vestibular rehabilitation for now. He has been swimming a good amount recently and has some pressure in his ears with baseline tinnitus, no changes in hearing. If he is not better in 2 weeks I like to see him in person.  I discussed the above assessment and treatment plan with the patient. The patient was provided an opportunity to ask questions and all were answered. The patient agreed with the plan and demonstrated an understanding of the instructions.   The patient was advised to call back or seek an in-person evaluation if the symptoms worsen or if the condition fails to improve as anticipated.   I provided 25 minutes of non-face-to-face time during this encounter, 15 minutes of additional time was needed to gather information, review chart, records, communicate/coordinate with staff remotely, troubleshooting the multiple errors that we get every time when trying to do video calls through the electronic medical record, WebEx, and Doximity, restart the encounter multiple times due to instability of the software, as well as complete documentation.   ___________________________________________ [name] [name], M.D., ABFM., CAQSM. Primary Care and Sports Medicine [facility]  Adjunct Professor of [employer] [facility] [employer]

## 2019-07-04 NOTE — Assessment & Plan Note (Signed)
Adding 5 days of prednisone, symptoms are resolving so we can hold off on vestibular rehabilitation for now. He has been swimming a good amount recently and has some pressure in his ears with baseline tinnitus, no changes in hearing. If he is not better in 2 weeks I like to see him in person.

## 2019-07-14 ENCOUNTER — Telehealth: Payer: Self-pay | Admitting: Osteopathic Medicine

## 2019-07-14 NOTE — Telephone Encounter (Signed)
Patient scheduled for Monday.

## 2019-07-14 NOTE — Telephone Encounter (Signed)
I am having problems with my ears and balance - experiencing vertigo.  It feels like there is fluid in my ears and I have had cold like symptoms.  I did a televise with Dr T and he prescribed steroids which helped but all symptoms remain after that was finished.

## 2019-07-17 ENCOUNTER — Other Ambulatory Visit: Payer: Self-pay | Admitting: Sports Medicine

## 2019-07-17 ENCOUNTER — Ambulatory Visit (INDEPENDENT_AMBULATORY_CARE_PROVIDER_SITE_OTHER): Payer: BC Managed Care – PPO | Admitting: Physician Assistant

## 2019-07-17 VITALS — Ht 72.0 in | Wt 192.0 lb

## 2019-07-17 DIAGNOSIS — H8113 Benign paroxysmal vertigo, bilateral: Secondary | ICD-10-CM | POA: Diagnosis not present

## 2019-07-17 DIAGNOSIS — N529 Male erectile dysfunction, unspecified: Secondary | ICD-10-CM

## 2019-07-17 MED ORDER — FLUTICASONE PROPIONATE 50 MCG/ACT NA SUSP: 2.0000 | Freq: Every day | NASAL | 6 refills | Status: DC

## 2019-07-24 ENCOUNTER — Encounter: Payer: Self-pay | Admitting: Physician Assistant

## 2019-07-24 NOTE — Progress Notes (Signed)
   Subjective:    Patient ID: Raymond Craig, male    DOB: [birth year], 58 y.o.   MRN: [id number]  HPI  Pt is a 57 yo male who presents to the clinic to follow up on dizziness. He was seen virtually on 07/04/19 and suspected to have BPPV. He continues to have no URI symptoms. No hearing changes. No nausea or vomiting. Episodes of dizziness are not worse just intermittent. Prednisone helped significantly and then symptoms started to come back.   .. Active Ambulatory Problems    Diagnosis Date Noted  . Hyperlipidemia 04/18/2013  . Erectile dysfunction 04/18/2013  . Benign prostatic hyperplasia with urinary retention 04/18/2013  . Family history of pancreatic cancer 04/18/2013  . History of colonic polyps 06/30/2013  . Left nephrolithiasis 02/26/2014  . Hyperglycemia 11/22/2014  . Insomnia 11/26/2015  . Complicated UTI (urinary tract infection) 09/15/2016  . Right ankle pain 12/01/2017  . Elevated blood pressure reading in office without diagnosis of hypertension 10/26/2018  . Rupture of peroneal tendon 12/08/2018  . Benign positional vertigo 07/04/2019   Resolved Ambulatory Problems    Diagnosis Date Noted  . No Resolved Ambulatory Problems   Past Medical History:  Diagnosis Date  . BPH (benign prostatic hypertrophy) with urinary retention 04/18/2013  . Prostatitis        Review of Systems See HPI.     Objective:   Physical Exam Vitals signs reviewed.  Constitutional:      Appearance: Normal appearance.  HENT:     Head: Normocephalic.     Right Ear: Tympanic membrane, ear canal and external ear normal. There is no impacted cerumen.     Left Ear: Tympanic membrane, ear canal and external ear normal. There is no impacted cerumen.     Nose: Nose normal. No congestion.     Mouth/Throat:     Mouth: Mucous membranes are moist.  Eyes:     Extraocular Movements: Extraocular movements intact.     Conjunctiva/sclera: Conjunctivae normal.     Pupils: Pupils are equal, round, and  reactive to light.  Cardiovascular:     Rate and Rhythm: Normal rate and regular rhythm.     Pulses: Normal pulses.  Pulmonary:     Effort: Pulmonary effort is normal.     Breath sounds: Normal breath sounds.  Neurological:     General: No focal deficit present.     Mental Status: He is alert and oriented to person, place, and time.     Motor: No weakness.     Coordination: Coordination normal.     Gait: Gait normal.     Comments: dix hallpike done bilaterally. No nystagmus but did endorse dizziness with maneuver although worse with sitting up.   Psychiatric:        Mood and Affect: Mood normal.           Assessment & Plan:  .Marland KitchenJohn was seen today for dizziness and ear pain.  Diagnoses and all orders for this visit:  BPPV (benign paroxysmal positional vertigo), bilateral -     fluticasone (FLONASE) 50 MCG/ACT nasal spray; Place 2 sprays into both nostrils daily.   Orthostatic BP looks good.  Pt declines Pt for vestibular rehab for now.  Continue epley manuevers and gave copy.  Start flonase.  Reassured normal exam today.  Follow up as needed. If would like PT referral call back.  HO given with information.

## 2019-08-24 ENCOUNTER — Other Ambulatory Visit: Payer: Self-pay | Admitting: Osteopathic Medicine

## 2019-08-24 DIAGNOSIS — G4700 Insomnia, unspecified: Secondary | ICD-10-CM

## 2019-09-12 ENCOUNTER — Other Ambulatory Visit: Payer: Self-pay

## 2019-09-12 DIAGNOSIS — N529 Male erectile dysfunction, unspecified: Secondary | ICD-10-CM

## 2019-09-12 MED ORDER — TADALAFIL 2.5 MG PO TABS: 1.0000 | ORAL_TABLET | Freq: Every day | ORAL | 30 refills | Status: DC

## 2019-09-24 ENCOUNTER — Other Ambulatory Visit: Payer: Self-pay | Admitting: Physician Assistant

## 2019-09-24 DIAGNOSIS — G4700 Insomnia, unspecified: Secondary | ICD-10-CM

## 2019-09-25 NOTE — Telephone Encounter (Signed)
Dr. Sheppard Coil patient.  Last physical 10/26/2018. Last filled 08/24/2019 #30 with no refills.

## 2019-10-25 ENCOUNTER — Other Ambulatory Visit: Payer: Self-pay | Admitting: Osteopathic Medicine

## 2019-10-25 DIAGNOSIS — G4700 Insomnia, unspecified: Secondary | ICD-10-CM

## 2020-01-01 ENCOUNTER — Telehealth: Payer: Self-pay | Admitting: Osteopathic Medicine

## 2020-01-01 NOTE — Telephone Encounter (Signed)
Received fax for PA on Zolpidem Tartrate sent through cover my meds waiting on determination. - CF

## 2020-01-02 NOTE — Telephone Encounter (Signed)
Received fax from [facility] they approved coverage on Zolpidem from 01/01/20 - 01/01/23 as long as patient remains on plan. - CF

## 2020-01-10 ENCOUNTER — Ambulatory Visit (INDEPENDENT_AMBULATORY_CARE_PROVIDER_SITE_OTHER): Payer: BC Managed Care – PPO | Admitting: Osteopathic Medicine

## 2020-01-10 ENCOUNTER — Other Ambulatory Visit: Payer: Self-pay

## 2020-01-10 ENCOUNTER — Encounter: Payer: Self-pay | Admitting: Osteopathic Medicine

## 2020-01-10 VITALS — BP 156/89 | HR 65 | Ht 72.0 in | Wt 192.0 lb

## 2020-01-10 DIAGNOSIS — R030 Elevated blood-pressure reading, without diagnosis of hypertension: Secondary | ICD-10-CM | POA: Diagnosis not present

## 2020-01-10 DIAGNOSIS — Z0000 Encounter for general adult medical examination without abnormal findings: Secondary | ICD-10-CM | POA: Diagnosis not present

## 2020-01-10 DIAGNOSIS — Z125 Encounter for screening for malignant neoplasm of prostate: Secondary | ICD-10-CM

## 2020-01-10 DIAGNOSIS — R739 Hyperglycemia, unspecified: Secondary | ICD-10-CM | POA: Diagnosis not present

## 2020-01-10 NOTE — Patient Instructions (Signed)
General Preventive Care  Most recent routine screening lipids/other labs: ordered today, please return for fasting blood work!   Blood pressure goal 130/80 or less   Tobacco: don't!   Alcohol: responsible moderation is ok for most adults - if you have concerns about your alcohol intake, please talk to me!   Exercise: as tolerated to reduce risk of cardiovascular disease and diabetes. Strength training will also prevent osteoporosis.   Mental health: if need for mental health care (medicines, counseling, other), or concerns about moods, please let me know!   Sexual health: if need for STD testing, or if concerns with libido/pain problems, please let me know!   Advanced Directive: Living Will and/or Healthcare Power of Attorney recommended for all adults, regardless of age or health.  Vaccines  Flu vaccine: recommended for almost everyone, every fall.   Shingles vaccine: Shingrix completed!   Pneumonia vaccines: Prevnar and Pneumovax recommended after age 62, or sooner if certain medical conditions.  Tetanus booster: Tdap recommended every 10 years. Due 10/2024  Cancer screenings   Colon cancer screening: recommended for everyone at age 68, due 01/2022 per records, can confirm this with Dr Ardis Hughs' office  Prostate cancer screening: PSA blood test annually age 15-71  Lung cancer screening: not needed for non-smokers  Infection screenings  HIV, Gonorrhea/Chlamydia: screening as needed  Hepatitis C: recommended for anyone born [id number]  TB: certain at-risk populations, or depending on work requirements and/or travel history Other  Bone Density Test: recommended for men at age 29

## 2020-01-10 NOTE — Progress Notes (Signed)
Raymond Craig is a 59 y.o. male who presents to  [place] at [facility]  today, 01/10/20, seeking care for the following: . Annual  . BP - borderline, same last year, hasn't been checking at home   BP Readings from Last 3 Encounters:  01/10/20 (!) 156/89  10/26/18 (!) 157/75  05/17/18 121/76           ASSESSMENT & PLAN with other pertinent history/findings:  The primary encounter diagnosis was Annual physical exam. Diagnoses of Elevated blood pressure reading in office without diagnosis of hypertension, Prostate cancer screening, and Hyperglycemia were also pertinent to this visit.   Reminder set - pt to check BP t home, may need Rx if >130/80, needs labs      General Preventive Care  Most recent routine screening lipids/other labs: ordered today, please return for fasting blood work!   Blood pressure goal 130/80 or less   Tobacco: don't!   Alcohol: responsible moderation is ok for most adults - if you have concerns about your alcohol intake, please talk to me!   Exercise: as tolerated to reduce risk of cardiovascular disease and diabetes. Strength training will also prevent osteoporosis.   Mental health: if need for mental health care (medicines, counseling, other), or concerns about moods, please let me know!   Sexual health: if need for STD testing, or if concerns with libido/pain problems, please let me know!   Advanced Directive: Living Will and/or Healthcare Power of Attorney recommended for all adults, regardless of age or health.  Vaccines  Flu vaccine: recommended for almost everyone, every fall.   Shingles vaccine: Shingrix completed!   Pneumonia vaccines: Prevnar and Pneumovax recommended after age 52, or sooner if certain medical conditions.  Tetanus booster: Tdap recommended every 10 years. Due 10/2024  Cancer screenings   Colon cancer screening: recommended for everyone at age 18, due 01/2022 per  records, can confirm this with Dr Ardis Hughs' office  Prostate cancer screening: PSA blood test annually age 33-71  Lung cancer screening: not needed for non-smokers  Infection screenings  HIV, Gonorrhea/Chlamydia: screening as needed  Hepatitis C: recommended for anyone born [id number]  TB: certain at-risk populations, or depending on work requirements and/or travel history Other  Bone Density Test: recommended for men at age 39         Orders Placed This Encounter  Procedures  . CBC  . COMPLETE METABOLIC PANEL WITH GFR  . Lipid panel  . Hemoglobin A1c  . PSA, Total with Reflex to PSA, Free    No orders of the defined types were placed in this encounter.  Constitutional:  . VSS, see nurse notes . General Appearance: alert, well-developed, well-nourished, NAD Eyes: Marland Kitchen Normal lids and conjunctive, non-icteric sclera . PERRLA Ears, Nose, Mouth, Throat: . Normal appearance . MMM, posterior pharynx without erythema/exudate Neck: . No masses, trachea midline . No thyroid enlargement/tenderness/mass appreciated Respiratory: . Normal respiratory effort . Breath sounds normal, no wheeze/rhonchi/rales Cardiovascular: . S1/S2 normal, no murmur/rub/gallop auscultated . Pedal pulse II/IV bilaterally DP and PT . No lower extremity edema Gastrointestinal: . Nontender, no masses . No hepatomegaly, no splenomegaly . No hernia appreciated Musculoskeletal:  . Gait normal . No clubbing/cyanosis of digits Neurological: . No cranial nerve deficit on limited exam . Motor and sensation intact and symmetric Psychiatric: . Normal judgment/insight . Normal mood and affect     Follow-up instructions: Return for RECHECK PENDING RESULTS / IF WORSE OR CHANGE.  BP (!) 156/89   Pulse 65   Ht 6' (1.829 m)   Wt 192 lb (87.1 kg)   SpO2 98%   BMI 26.04 kg/m   Current Meds  Medication Sig  .  fluticasone (FLONASE) 50 MCG/ACT nasal spray Place 2 sprays into both nostrils daily.  . meloxicam (MOBIC) 15 MG tablet Take 15 mg by mouth daily.  . Tadalafil 2.5 MG TABS Take 1 tablet (2.5 mg total) by mouth daily.  Marland Kitchen zolpidem (AMBIEN) 10 MG tablet TAKE 0.5-1 TABLETS (5-10 MG TOTAL) BY MOUTH AT BEDTIME AS NEEDED.   Current Facility-Administered Medications for the 01/10/20 encounter (Office Visit) with Emeterio Reeve, DO  Medication  . 0.9 %  sodium chloride infusion    No results found for this or any previous visit (from the past 72 hour(s)).  No results found.  Depression screen [facility] 2/9 10/26/2018 08/20/2017  Decreased Interest 0 0  Down, Depressed, Hopeless 0 0  PHQ - 2 Score 0 0  Altered sleeping 3 2  Tired, decreased energy 0 1  Change in appetite 0 0  Feeling bad or failure about yourself  0 0  Trouble concentrating 0 0  Moving slowly or fidgety/restless 0 0  Suicidal thoughts 0 0  PHQ-9 Score 3 3  Difficult doing work/chores Not difficult at all Somewhat difficult    GAD 7 : Generalized Anxiety Score 10/26/2018  Nervous, Anxious, on Edge 0  Control/stop worrying 0  Worry too much - different things 0  Trouble relaxing 0  Restless 0  Easily annoyed or irritable 0  Afraid - awful might happen 0  Total GAD 7 Score 0      All questions at time of visit were answered - patient instructed to contact office with any additional concerns or updates.  ER/RTC precautions were reviewed with the patient.  Please note: voice recognition software was used to produce this document, and typos may escape review. Please contact Dr. Sheppard Coil for any needed clarifications.

## 2020-01-12 ENCOUNTER — Encounter: Payer: Self-pay | Admitting: Osteopathic Medicine

## 2020-01-12 LAB — PSA, TOTAL WITH REFLEX TO PSA, FREE: PSA, Total: 1.7 ng/mL (ref <=4.0)

## 2020-01-12 LAB — CBC
HCT: 48.1 % (ref 38.5–50.0)
Hemoglobin: 16.1 g/dL (ref 13.2–17.1)
MCH: 30.2 pg (ref 27.0–33.0)
MCHC: 33.5 g/dL (ref 32.0–36.0)
MCV: 90.2 fL (ref 80.0–100.0)
MPV: 9.4 fL (ref 7.5–12.5)
Platelets: 184 10*3/uL (ref 140–400)
RBC: 5.33 10*6/uL (ref 4.20–5.80)
RDW: 12.4 % (ref 11.0–15.0)
WBC: 4.8 10*3/uL (ref 3.8–10.8)

## 2020-01-12 LAB — COMPLETE METABOLIC PANEL WITH GFR
AG Ratio: 1.6 (calc) (ref 1.0–2.5)
ALT: 27 U/L (ref 9–46)
AST: 18 U/L (ref 10–35)
Albumin: 4.1 g/dL (ref 3.6–5.1)
Alkaline phosphatase (APISO): 39 U/L (ref 35–144)
BUN: 17 mg/dL (ref 7–25)
CO2: 30 mmol/L (ref 20–32)
Calcium: 9.9 mg/dL (ref 8.6–10.3)
Chloride: 102 mmol/L (ref 98–110)
Creat: 1.02 mg/dL (ref 0.70–1.33)
GFR, Est African American: 94 mL/min/{1.73_m2} (ref >=60)
GFR, Est Non African American: 81 mL/min/{1.73_m2} (ref >=60)
Globulin: 2.6 g/dL (calc) (ref 1.9–3.7)
Glucose, Bld: 139 mg/dL — ABNORMAL HIGH (ref 65–99)
Potassium: 4 mmol/L (ref 3.5–5.3)
Sodium: 138 mmol/L (ref 135–146)
Total Bilirubin: 0.6 mg/dL (ref 0.2–1.2)
Total Protein: 6.7 g/dL (ref 6.1–8.1)

## 2020-01-12 LAB — LIPID PANEL
Cholesterol: 299 mg/dL — ABNORMAL HIGH (ref <200)
HDL: 49 mg/dL (ref >=40)
LDL Cholesterol (Calc): 224 mg/dL (calc) — ABNORMAL HIGH
Non-HDL Cholesterol (Calc): 250 mg/dL (calc) — ABNORMAL HIGH (ref <130)
Total CHOL/HDL Ratio: 6.1 (calc) — ABNORMAL HIGH (ref <5.0)
Triglycerides: 119 mg/dL (ref <150)

## 2020-01-12 LAB — HEMOGLOBIN A1C
Hgb A1c MFr Bld: 6 % of total Hgb — ABNORMAL HIGH (ref <5.7)
Mean Plasma Glucose: 126 (calc)
eAG (mmol/L): 7 (calc)

## 2020-01-12 MED ORDER — ATORVASTATIN CALCIUM 20 MG PO TABS: 20.0000 mg | ORAL_TABLET | Freq: Every day | ORAL | 3 refills | Status: DC

## 2020-01-12 NOTE — Addendum Note (Signed)
Addended by: Maryla Morrow on: 01/12/2020 10:02 AM   Modules accepted: Orders

## 2020-01-26 ENCOUNTER — Other Ambulatory Visit: Payer: Self-pay | Admitting: Osteopathic Medicine

## 2020-01-26 DIAGNOSIS — G4700 Insomnia, unspecified: Secondary | ICD-10-CM

## 2020-03-22 ENCOUNTER — Other Ambulatory Visit: Payer: Self-pay

## 2020-03-22 DIAGNOSIS — E785 Hyperlipidemia, unspecified: Secondary | ICD-10-CM

## 2020-03-22 NOTE — Progress Notes (Signed)
Labs ordered, per lab results.

## 2020-04-01 ENCOUNTER — Encounter: Payer: Self-pay | Admitting: Osteopathic Medicine

## 2020-04-01 ENCOUNTER — Telehealth (INDEPENDENT_AMBULATORY_CARE_PROVIDER_SITE_OTHER): Payer: BC Managed Care – PPO | Admitting: Osteopathic Medicine

## 2020-04-01 VITALS — BP 114/76 | Wt 205.0 lb

## 2020-04-01 DIAGNOSIS — U071 COVID-19: Secondary | ICD-10-CM | POA: Diagnosis not present

## 2020-04-01 MED ORDER — IPRATROPIUM BROMIDE 0.06 % NA SOLN: 2.0000 | Freq: Four times a day (QID) | NASAL | 12 refills | Status: DC

## 2020-04-01 MED ORDER — GUAIFENESIN-CODEINE 100-10 MG/5ML PO SOLN: 5.0000 mL | ORAL | 0 refills | Status: DC | PRN

## 2020-04-01 NOTE — Progress Notes (Signed)
Virtual Visit via Video (App used: MyChart) Note  I connected with      Raymond Craig on 04/01/20 at 2:34 PM  by a telemedicine application and verified that I am speaking with the correct person using two identifiers.  Patient is in separate location  I am in office   I discussed the limitations of evaluation and management by telemedicine and the availability of in person appointments. The patient expressed understanding and agreed to proceed.  History of Present Illness: Raymond Craig is a 59 y.o. male who would like to discuss concern for feeling ill, (+)home COVID test. Runny nose, sore throat, cough, congestion. S/p vaccination w/ COVID. Started yesterday, feels same today, cough might be a little bit better.      Observations/Objective: BP 114/76    Wt 205 lb (93 kg)    BMI 27.80 kg/m  BP Readings from Last 3 Encounters:  04/01/20 114/76  01/10/20 (!) 156/89  10/26/18 (!) 157/75   Exam: Normal Speech.  NAD  Lab and Radiology Results No results found for this or any previous visit (from the past 72 hour(s)). No results found.     Assessment and Plan: 59 y.o. male with The encounter diagnosis was COVID-19.  Pt speaking in full sentences, does not feel SOB. States this "feels like a bad head cold," ok to monitor for now, will call if worse, I offered steroids / referral to infusion clinic if needed, pt would like to hold off for now and see how he does. I advised we could confirm w/ PCR testing or rapid test (not sure how reliable the home tests are but I'm inclined to trust a (+) more than a (-) test)   PDMP not reviewed this encounter. No orders of the defined types were placed in this encounter.  Meds ordered this encounter  Medications   ipratropium (ATROVENT) 0.06 % nasal spray    Sig: Place 2 sprays into both nostrils 4 (four) times daily.    Dispense:  15 mL    Refill:  12   guaiFENesin-codeine 100-10 MG/5ML syrup    Sig: Take 5-10 mLs by mouth every 4  (four) hours as needed for cough.    Dispense:  180 mL    Refill:  0   Patient Instructions  Over-the-Counter Medications & Home Remedies for Upper Respiratory Illness  Note: the following list assumes no pregnancy, normal liver & kidney function and no other drug interactions. Dr. Sheppard Coil has highlighted medications which are safe for you to use, but these may not be appropriate for everyone. Always ask a pharmacist or qualified medical provider if you have any questions!   Aches/Pains, Fever, Headache Acetaminophen (Tylenol) 500 mg tablets - take max 2 tablets (1000 mg) every 6 hours (4 times per day)  Ibuprofen (Motrin) 200 mg tablets - take max 4 tablets (800 mg) every 6 hours  Sinus Congestion Prescription Atrovent as directed Nasal Saline if desired Phenylephrine (Sudafed) 10 mg tablets every 4 hours (or the 12-hour formulation) Diphenhydramine (Benadryl) 25 mg tablets - take max 2 tablets every 4 hours  Cough & Sore Throat Prescription cough pills or syrups as directed Dextromethorphan (Robitussin, others) - cough suppressant Guaifenesin (Robitussin, Mucinex, others) - expectorant (helps cough up mucus) (Dextromethorphan and Guaifenesin also come in a combination tablet) Lozenges w/ Benzocaine + Menthol (Cepacol) Honey - as much as you want! Teas which "coat the throat" - look for ingredients Elm Bark, Licorice Root, Marshmallow Root  Other Zinc  Lozenges within 24 hours of symptoms onset - mixed evidence this shortens the duration of the common cold Don't waste your money on Vitamin C or Echinacea    Instructions sent via MyChart. If MyChart not available, pt was given option for info via personal e-mail w/ no guarantee of protected health info over unsecured e-mail communication, and MyChart sign-up instructions were sent to patient.   Follow Up Instructions: Return if symptoms worsen or fail to improve.    I discussed the assessment and treatment plan with the  patient. The patient was provided an opportunity to ask questions and all were answered. The patient agreed with the plan and demonstrated an understanding of the instructions.   The patient was advised to call back or seek an in-person evaluation if any new concerns, if symptoms worsen or if the condition fails to improve as anticipated.  20 minutes of non-face-to-face time was provided during this encounter.      . . . . . . . . . . . . . Marland Kitchen                   Historical information moved to improve visibility of documentation.  Past Medical History:  Diagnosis Date   BPH (benign prostatic hypertrophy) with urinary retention 04/18/2013   Erectile dysfunction 04/18/2013   Family history of pancreatic cancer 04/18/2013   Hyperlipidemia 04/18/2013   Prostatitis    Past Surgical History:  Procedure Laterality Date   COLONOSCOPY     lipoma removal  2014   left chest   Social History   Tobacco Use   Smoking status: Never Smoker   Smokeless tobacco: Never Used  Substance Use Topics   Alcohol use: Yes    Alcohol/week: 4.0 standard drinks    Types: 4 Cans of beer per week   family history includes Breast cancer in his mother; Pancreatic cancer in his father and paternal uncle.  Medications: Current Outpatient Medications  Medication Sig Dispense Refill   atorvastatin (LIPITOR) 20 MG tablet Take 1 tablet (20 mg total) by mouth daily. 90 tablet 3   fluticasone (FLONASE) 50 MCG/ACT nasal spray Place 2 sprays into both nostrils daily. 16 g 6   meloxicam (MOBIC) 15 MG tablet Take 15 mg by mouth daily.     Tadalafil 2.5 MG TABS Take 1 tablet (2.5 mg total) by mouth daily. 30 tablet 30   zolpidem (AMBIEN) 10 MG tablet TAKE 0.5-1 TABLETS (5-10 MG TOTAL) BY MOUTH AT BEDTIME AS NEEDED. 90 tablet 0   guaiFENesin-codeine 100-10 MG/5ML syrup Take 5-10 mLs by mouth every 4 (four) hours as needed for cough. 180 mL 0   ipratropium (ATROVENT) 0.06 %  nasal spray Place 2 sprays into both nostrils 4 (four) times daily. 15 mL 12   Current Facility-Administered Medications  Medication Dose Route Frequency Provider Last Rate Last Admin   0.9 %  sodium chloride infusion  500 mL Intravenous Continuous Milus Banister, MD       No Known Allergies

## 2020-04-01 NOTE — Patient Instructions (Addendum)
Over-the-Counter Medications & Home Remedies for Viral Respiratory Illness  Aches/Pains, Fever, Headache Acetaminophen (Tylenol) 500 mg tablets - take max 2 tablets (1000 mg) every 6 hours (4 times per day)  Ibuprofen (Motrin) 200 mg tablets - take max 4 tablets (800 mg) every 6 hours  Sinus Congestion Prescription Atrovent as directed Nasal Saline if desired Phenylephrine (Sudafed) 10 mg tablets every 4 hours (or the 12-hour formulation) Diphenhydramine (Benadryl) 25 mg tablets - take max 2 tablets every 4 hours  Cough & Sore Throat Prescription cough pills or syrups as directed Dextromethorphan (Robitussin, others) - cough suppressant Guaifenesin (Robitussin, Mucinex, others) - expectorant (helps cough up mucus) - note this is in the Rx cough syrup, do not duplicate!  (Dextromethorphan and Guaifenesin also come in a combination tablet) Lozenges w/ Benzocaine + Menthol (Cepacol) Honey - as much as you want! Teas which "coat the throat" - look for ingredients Elm Bark, Licorice Root, Marshmallow Root  Other Zinc Lozenges within 24 hours of symptoms onset - mixed evidence this shortens the duration of the common cold Don't waste your money on Vitamin C or Echinacea

## 2020-04-04 ENCOUNTER — Telehealth: Payer: Self-pay | Admitting: Osteopathic Medicine

## 2020-04-04 NOTE — Telephone Encounter (Signed)
Positive Rapid test 03/31/20 Negative Lab screening 04/03/20   PT is emailing information to [email or web address]

## 2020-04-05 ENCOUNTER — Telehealth (INDEPENDENT_AMBULATORY_CARE_PROVIDER_SITE_OTHER): Payer: BC Managed Care – PPO | Admitting: Osteopathic Medicine

## 2020-04-05 ENCOUNTER — Encounter: Payer: Self-pay | Admitting: Osteopathic Medicine

## 2020-04-05 VITALS — BP 119/78 | Wt 205.0 lb

## 2020-04-05 DIAGNOSIS — J069 Acute upper respiratory infection, unspecified: Secondary | ICD-10-CM | POA: Diagnosis not present

## 2020-04-05 NOTE — Progress Notes (Signed)
Virtual Visit via Video (App used: MyChart) Note  I connected with      Raymond Craig on 04/05/20 at 11:12 AM  by a telemedicine application and verified that I am speaking with the correct person using two identifiers.  Patient is at home I am in office   I discussed the limitations of evaluation and management by telemedicine and the availability of in person appointments. The patient expressed understanding and agreed to proceed.  History of Present Illness: Raymond Craig is a 59 y.o. male who would like to discuss respiratory illness  Following up after virtual visit 04/01/2020, 4 days ago positive home Covid test. 5 days ago, cough, runny nose, sore throat, sinus congestion. Was again tested for Covid 2 days ago and this was negative Patient seeking advice on how to proceed given incongruent test results Reports feeling a bit better but cough and sinus congestion have persisted      Observations/Objective: BP 119/78   Wt 205 lb (93 kg)   BMI 27.80 kg/m  BP Readings from Last 3 Encounters:  04/05/20 119/78  04/01/20 114/76  01/10/20 (!) 156/89   Exam: Normal Speech.  NAD  Lab and Radiology Results No results found for this or any previous visit (from the past 72 hour(s)). No results found.     Assessment and Plan: 59 y.o. male with The encounter diagnosis was Viral upper respiratory infection possibly COVID-19.  Advised patient I would still consider this to be Covid, even given incongruent testing I am not sure how his vaccination status may cause him to clear virus a bit faster, we just do not know.  Certainly could be other respiratory virus such as RSV, which we are seeing a resurgence of as well.  At any rate, would continue isolation for full 10 days from initial onset of symptoms/3 days fever free and respiratory symptom improvement.  Note written   Follow Up Instructions: Return if symptoms worsen or fail to improve.    I discussed the assessment and  treatment plan with the patient. The patient was provided an opportunity to ask questions and all were answered. The patient agreed with the plan and demonstrated an understanding of the instructions.   The patient was advised to call back or seek an in-person evaluation if any new concerns, if symptoms worsen or if the condition fails to improve as anticipated.  20 minutes of non-face-to-face time was provided during this encounter.      . . . . . . . . . . . . . Marland Kitchen                   Historical information moved to improve visibility of documentation.  Past Medical History:  Diagnosis Date  . BPH (benign prostatic hypertrophy) with urinary retention 04/18/2013  . Erectile dysfunction 04/18/2013  . Family history of pancreatic cancer 04/18/2013  . Hyperlipidemia 04/18/2013  . Prostatitis    Past Surgical History:  Procedure Laterality Date  . COLONOSCOPY    . lipoma removal  2014   left chest   Social History   Tobacco Use  . Smoking status: Never Smoker  . Smokeless tobacco: Never Used  Substance Use Topics  . Alcohol use: Yes    Alcohol/week: 4.0 standard drinks    Types: 4 Cans of beer per week   family history includes Breast cancer in his mother; Pancreatic cancer in his father and paternal uncle.  Medications: Current Outpatient Medications  Medication Sig  Dispense Refill  . atorvastatin (LIPITOR) 20 MG tablet Take 1 tablet (20 mg total) by mouth daily. 90 tablet 3  . fluticasone (FLONASE) 50 MCG/ACT nasal spray Place 2 sprays into both nostrils daily. 16 g 6  . guaiFENesin-codeine 100-10 MG/5ML syrup Take 5-10 mLs by mouth every 4 (four) hours as needed for cough. 180 mL 0  . ipratropium (ATROVENT) 0.06 % nasal spray Place 2 sprays into both nostrils 4 (four) times daily. 15 mL 12  . meloxicam (MOBIC) 15 MG tablet Take 15 mg by mouth daily.    . Tadalafil 2.5 MG TABS Take 1 tablet (2.5 mg total) by mouth daily. 30 tablet 30  . zolpidem  (AMBIEN) 10 MG tablet TAKE 0.5-1 TABLETS (5-10 MG TOTAL) BY MOUTH AT BEDTIME AS NEEDED. 90 tablet 0   Current Facility-Administered Medications  Medication Dose Route Frequency Provider Last Rate Last Admin  . 0.9 %  sodium chloride infusion  500 mL Intravenous Continuous Milus Banister, MD       No Known Allergies

## 2020-05-10 ENCOUNTER — Other Ambulatory Visit: Payer: Self-pay

## 2020-05-10 DIAGNOSIS — G4700 Insomnia, unspecified: Secondary | ICD-10-CM

## 2020-05-10 NOTE — Telephone Encounter (Signed)
Pt called requesting a med refill for zolpidem. Pt did not realize that he was running so low on his medication. Currently does not have any medication on hand. Requesting to expedite his request. Rx pended.

## 2020-05-12 ENCOUNTER — Other Ambulatory Visit: Payer: Self-pay | Admitting: Osteopathic Medicine

## 2020-05-12 DIAGNOSIS — G4700 Insomnia, unspecified: Secondary | ICD-10-CM

## 2020-05-13 ENCOUNTER — Encounter: Payer: Self-pay | Admitting: Osteopathic Medicine

## 2020-05-13 NOTE — Telephone Encounter (Signed)
Please review and refill if appropriate.  T. Lenix Benoist, CMA  

## 2020-08-11 ENCOUNTER — Other Ambulatory Visit: Payer: Self-pay | Admitting: Osteopathic Medicine

## 2020-08-11 DIAGNOSIS — G4700 Insomnia, unspecified: Secondary | ICD-10-CM

## 2020-09-11 ENCOUNTER — Other Ambulatory Visit: Payer: Self-pay | Admitting: Osteopathic Medicine

## 2020-09-11 DIAGNOSIS — N529 Male erectile dysfunction, unspecified: Secondary | ICD-10-CM

## 2020-09-26 ENCOUNTER — Encounter: Payer: BC Managed Care – PPO | Admitting: Osteopathic Medicine

## 2020-10-31 ENCOUNTER — Other Ambulatory Visit: Payer: Self-pay | Admitting: Osteopathic Medicine

## 2020-11-01 ENCOUNTER — Other Ambulatory Visit: Payer: Self-pay | Admitting: Osteopathic Medicine

## 2020-11-01 DIAGNOSIS — N529 Male erectile dysfunction, unspecified: Secondary | ICD-10-CM

## 2020-11-10 ENCOUNTER — Other Ambulatory Visit: Payer: Self-pay | Admitting: Osteopathic Medicine

## 2020-11-10 DIAGNOSIS — G4700 Insomnia, unspecified: Secondary | ICD-10-CM

## 2020-11-23 ENCOUNTER — Encounter: Payer: Self-pay | Admitting: Osteopathic Medicine

## 2021-01-08 ENCOUNTER — Other Ambulatory Visit: Payer: Self-pay | Admitting: Osteopathic Medicine

## 2021-01-08 DIAGNOSIS — N529 Male erectile dysfunction, unspecified: Secondary | ICD-10-CM

## 2021-01-21 ENCOUNTER — Encounter: Payer: Self-pay | Admitting: Osteopathic Medicine

## 2021-02-08 ENCOUNTER — Other Ambulatory Visit: Payer: Self-pay | Admitting: Osteopathic Medicine

## 2021-02-08 DIAGNOSIS — G4700 Insomnia, unspecified: Secondary | ICD-10-CM

## 2021-02-10 ENCOUNTER — Other Ambulatory Visit: Payer: Self-pay | Admitting: Osteopathic Medicine

## 2021-02-10 DIAGNOSIS — G4700 Insomnia, unspecified: Secondary | ICD-10-CM

## 2021-02-11 ENCOUNTER — Telehealth: Payer: Self-pay

## 2021-02-11 MED ORDER — MELOXICAM 15 MG PO TABS: 15.0000 mg | ORAL_TABLET | Freq: Every day | ORAL | 0 refills | Status: DC

## 2021-02-11 MED ORDER — ZOLPIDEM TARTRATE 10 MG PO TABS: 5.0000 mg | ORAL_TABLET | Freq: Every evening | ORAL | 0 refills | Status: DC | PRN

## 2021-02-11 NOTE — Telephone Encounter (Signed)
Pt called and stated that he has been having muscle spasms in his back. Pt has appointment on 02/21/21 but wants to know if there's anything you could send in that would help him until then.

## 2021-02-11 NOTE — Telephone Encounter (Signed)
Refilled the meloxicam If new Rx desired, needs appt  Can see sports me / acute visit if needed

## 2021-02-12 NOTE — Telephone Encounter (Signed)
Pt.notified

## 2021-02-21 ENCOUNTER — Other Ambulatory Visit: Payer: Self-pay

## 2021-02-21 ENCOUNTER — Ambulatory Visit (INDEPENDENT_AMBULATORY_CARE_PROVIDER_SITE_OTHER): Payer: BC Managed Care – PPO | Admitting: Osteopathic Medicine

## 2021-02-21 ENCOUNTER — Other Ambulatory Visit: Payer: Self-pay | Admitting: Osteopathic Medicine

## 2021-02-21 ENCOUNTER — Encounter: Payer: Self-pay | Admitting: Osteopathic Medicine

## 2021-02-21 VITALS — BP 143/89 | HR 62 | Ht 72.0 in | Wt 213.7 lb

## 2021-02-21 DIAGNOSIS — Z0000 Encounter for general adult medical examination without abnormal findings: Secondary | ICD-10-CM

## 2021-02-21 DIAGNOSIS — N529 Male erectile dysfunction, unspecified: Secondary | ICD-10-CM

## 2021-02-21 DIAGNOSIS — G4700 Insomnia, unspecified: Secondary | ICD-10-CM | POA: Diagnosis not present

## 2021-02-21 DIAGNOSIS — R739 Hyperglycemia, unspecified: Secondary | ICD-10-CM

## 2021-02-21 DIAGNOSIS — E785 Hyperlipidemia, unspecified: Secondary | ICD-10-CM | POA: Diagnosis not present

## 2021-02-21 MED ORDER — MELOXICAM 15 MG PO TABS: 15.0000 mg | ORAL_TABLET | Freq: Every day | ORAL | 0 refills | Status: DC

## 2021-02-21 MED ORDER — ATORVASTATIN CALCIUM 20 MG PO TABS: 20.0000 mg | ORAL_TABLET | Freq: Every day | ORAL | 3 refills | Status: DC

## 2021-02-21 NOTE — Progress Notes (Signed)
Raymond Craig is a 60 y.o. male who presents to  [place] [facility]  today, 02/21/21, seeking care for the following:  Annual physical / preventive care      ASSESSMENT & PLAN with other pertinent findings:  The primary encounter diagnosis was Annual physical exam. Diagnoses of Hyperlipidemia, unspecified hyperlipidemia type, Hyperglycemia, Insomnia, unspecified type, and Erectile dysfunction, unspecified erectile dysfunction type were also pertinent to this visit.   Refilled Rx Pt to monitor home BP   Patient Instructions  General Preventive Care Most recent routine screening labs: ordered today.  Blood pressure goal 130/80 or less.  Tobacco: don't!  Alcohol: responsible moderation is ok for most adults - if you have concerns about your alcohol intake, please talk to me!  Exercise: as tolerated to reduce risk of cardiovascular disease and diabetes. Strength training will also prevent osteoporosis.  Mental health: if need for mental health care (medicines, counseling, other), or concerns about moods, please let me know!  Sexual / Reproductive health: if need for STI testing, or if concerns with libido/pain problems, please let me know!  Advanced Directive: Living Will and/or Healthcare Power of Attorney recommended for all adults, regardless of age or health.  Vaccines Flu vaccine: for almost everyone, every fall.  Shingles vaccine: all done! Pneumonia vaccines: after age 71. Tetanus booster: every 10 years, due 2026 COVID vaccine: THANKS for getting your vaccines and boosters!  Cancer screenings  Colon cancer screening: Colonoscopy due 01/2022 Prostate cancer screening: PSA blood test age 4-71 Lung cancer screening: not needed for non-smokers  Infection screenings  HIV: recommended screening at least once age 52-65, more often as needed. Gonorrhea/Chlamydia: screening as needed, though many insurances require testing for anyone  on birth control pills. Hepatitis C: recommended once for everyone age 73-22 TB: certain at-risk populations, or depending on work requirements and/or travel history Other Bone Density Test: recommended for men at age 58 Abdominal Aortic Aneurysm: screening with ultrasound recommended once for men age 28-75 who have ever smoked   Orders Placed This Encounter  Procedures   CBC   COMPLETE METABOLIC PANEL WITH GFR   Lipid panel   Hemoglobin A1c   PSA, Total with Reflex to PSA, Free    Meds ordered this encounter  Medications   atorvastatin (LIPITOR) 20 MG tablet    Sig: Take 1 tablet (20 mg total) by mouth daily.    Dispense:  90 tablet    Refill:  3   meloxicam (MOBIC) 15 MG tablet    Sig: Take 1 tablet (15 mg total) by mouth daily.    Dispense:  30 tablet    Refill:  0     See below for relevant physical exam findings  See below for recent lab and imaging results reviewed  Medications, allergies, PMH, PSH, SocH, FamH reviewed below    Follow-up instructions: Return in about 1 year (around 02/21/2022) for [name] (call week prior to visit for lab orders).                                        Exam:  BP (!) 143/89   Pulse 62   Ht 6' (1.829 m)   Wt 213 lb 11.2 oz (96.9 kg)   SpO2 98%   BMI 28.98 kg/m  Constitutional: VS see above. General Appearance: alert, well-developed, well-nourished, NAD Neck: No masses, trachea midline.  Respiratory: Normal respiratory effort. no wheeze, no rhonchi, no rales Cardiovascular: S1/S2 normal, no murmur, no rub/gallop auscultated. RRR.  Musculoskeletal: Gait normal. Symmetric and independent movement of all extremities Abdominal: non-tender, non-distended, no appreciable organomegaly, neg Murphy's, BS [id number] Neurological: Normal balance/coordination. No tremor. Skin: warm, dry, intact.  Psychiatric: Normal judgment/insight. Normal mood and affect. Oriented x3.   No outpatient medications have been  marked as taking for the 02/21/21 encounter (Office Visit) with Emeterio Reeve, DO.   Current Facility-Administered Medications for the 02/21/21 encounter (Office Visit) with Emeterio Reeve, DO  Medication   0.9 %  sodium chloride infusion    No Known Allergies  Patient Active Problem List   Diagnosis Date Noted   Benign positional vertigo 07/04/2019   Rupture of peroneal tendon 12/08/2018   Elevated blood pressure reading in office without diagnosis of hypertension 10/26/2018   Right ankle pain 72/04/4708   Complicated UTI (urinary tract infection) 09/15/2016   Insomnia 11/26/2015   Hyperglycemia 11/22/2014   Left nephrolithiasis 02/26/2014   History of colonic polyps 06/30/2013   Hyperlipidemia 04/18/2013   Erectile dysfunction 04/18/2013   Benign prostatic hyperplasia with urinary retention 04/18/2013   Family history of pancreatic cancer 04/18/2013    Family History  Problem Relation Age of Onset   Breast cancer Mother    Pancreatic cancer Father    Pancreatic cancer Paternal Uncle    Colon cancer Neg Hx    Colon polyps Neg Hx    Esophageal cancer Neg Hx    Rectal cancer Neg Hx    Stomach cancer Neg Hx     Social History   Tobacco Use  Smoking Status Never  Smokeless Tobacco Never    Past Surgical History:  Procedure Laterality Date   COLONOSCOPY     lipoma removal  2014   left chest    Immunization History  Administered Date(s) Administered   Influenza Inj Mdck Quad Pf 05/17/2018   Influenza,inj,Quad PF,6+ Mos 04/18/2013, 09/14/2017, 03/28/2019, 05/06/2020   Influenza-Unspecified 05/08/2016, 05/17/2018, 03/28/2019   Moderna Sars-Covid-2 Vaccination 11/06/2019, 11/30/2019, 06/20/2020, 11/23/2020   Tdap 11/14/2014   Zoster Recombinat (Shingrix) 09/14/2017, 12/01/2017, 03/28/2019    No results found for this or any previous visit (from the past 2160 hour(s)).  No results found.     All questions at time of visit were answered - patient  instructed to contact office with any additional concerns or updates. ER/RTC precautions were reviewed with the patient as applicable.   Please note: manual typing as well as voice recognition software may have been used to produce this document - typos may escape review. Please contact Dr. Sheppard Coil for any needed clarifications.

## 2021-02-21 NOTE — Patient Instructions (Addendum)
General Preventive Care Most recent routine screening labs: ordered today.  Blood pressure goal 130/80 or less.  Tobacco: don't!  Alcohol: responsible moderation is ok for most adults - if you have concerns about your alcohol intake, please talk to me!  Exercise: as tolerated to reduce risk of cardiovascular disease and diabetes. Strength training will also prevent osteoporosis.  Mental health: if need for mental health care (medicines, counseling, other), or concerns about moods, please let me know!  Sexual / Reproductive health: if need for STI testing, or if concerns with libido/pain problems, please let me know!  Advanced Directive: Living Will and/or Healthcare Power of Attorney recommended for all adults, regardless of age or health.  Vaccines Flu vaccine: for almost everyone, every fall.  Shingles vaccine: all done! Pneumonia vaccines: after age 58. Tetanus booster: every 10 years, due 2026 COVID vaccine: THANKS for getting your vaccines and boosters!  Cancer screenings  Colon cancer screening: Colonoscopy due 01/2022 Prostate cancer screening: PSA blood test age 49-71 Lung cancer screening: not needed for non-smokers  Infection screenings  HIV: recommended screening at least once age 20-65, more often as needed. Gonorrhea/Chlamydia: screening as needed, though many insurances require testing for anyone on birth control pills. Hepatitis C: recommended once for everyone age 01-00 TB: certain at-risk populations, or depending on work requirements and/or travel history Other Bone Density Test: recommended for men at age 74 Abdominal Aortic Aneurysm: screening with ultrasound recommended once for men age 76-75 who have ever smoked

## 2021-02-25 LAB — CBC
HCT: 48.4 % (ref 38.5–50.0)
Hemoglobin: 15.8 g/dL (ref 13.2–17.1)
MCH: 29.6 pg (ref 27.0–33.0)
MCHC: 32.6 g/dL (ref 32.0–36.0)
MCV: 90.8 fL (ref 80.0–100.0)
MPV: 9.5 fL (ref 7.5–12.5)
Platelets: 162 10*3/uL (ref 140–400)
RBC: 5.33 10*6/uL (ref 4.20–5.80)
RDW: 12.4 % (ref 11.0–15.0)
WBC: 4.9 10*3/uL (ref 3.8–10.8)

## 2021-02-25 LAB — COMPLETE METABOLIC PANEL WITH GFR
AG Ratio: 1.8 (calc) (ref 1.0–2.5)
ALT: 23 U/L (ref 9–46)
AST: 20 U/L (ref 10–35)
Albumin: 4.2 g/dL (ref 3.6–5.1)
Alkaline phosphatase (APISO): 40 U/L (ref 35–144)
BUN: 15 mg/dL (ref 7–25)
CO2: 28 mmol/L (ref 20–32)
Calcium: 10.1 mg/dL (ref 8.6–10.3)
Chloride: 103 mmol/L (ref 98–110)
Creat: 0.91 mg/dL (ref 0.70–1.33)
GFR, Est African American: 107 mL/min/{1.73_m2} (ref >=60)
GFR, Est Non African American: 93 mL/min/{1.73_m2} (ref >=60)
Globulin: 2.4 g/dL (calc) (ref 1.9–3.7)
Glucose, Bld: 108 mg/dL — ABNORMAL HIGH (ref 65–99)
Potassium: 4 mmol/L (ref 3.5–5.3)
Sodium: 140 mmol/L (ref 135–146)
Total Bilirubin: 0.6 mg/dL (ref 0.2–1.2)
Total Protein: 6.6 g/dL (ref 6.1–8.1)

## 2021-02-25 LAB — LIPID PANEL
Cholesterol: 176 mg/dL (ref <200)
HDL: 51 mg/dL (ref >=40)
LDL Cholesterol (Calc): 108 mg/dL (calc) — ABNORMAL HIGH
Non-HDL Cholesterol (Calc): 125 mg/dL (calc) (ref <130)
Total CHOL/HDL Ratio: 3.5 (calc) (ref <5.0)
Triglycerides: 83 mg/dL (ref <150)

## 2021-02-25 LAB — PSA, TOTAL WITH REFLEX TO PSA, FREE: PSA, Total: 1.7 ng/mL (ref <=4.0)

## 2021-02-25 LAB — HEMOGLOBIN A1C
Hgb A1c MFr Bld: 5.6 % of total Hgb (ref <5.7)
Mean Plasma Glucose: 114 mg/dL
eAG (mmol/L): 6.3 mmol/L

## 2021-03-10 ENCOUNTER — Other Ambulatory Visit: Payer: Self-pay | Admitting: Osteopathic Medicine

## 2021-03-10 DIAGNOSIS — N529 Male erectile dysfunction, unspecified: Secondary | ICD-10-CM

## 2021-03-19 ENCOUNTER — Ambulatory Visit: Payer: BC Managed Care – PPO | Admitting: Sports Medicine

## 2021-03-19 ENCOUNTER — Other Ambulatory Visit: Payer: Self-pay

## 2021-03-19 ENCOUNTER — Ambulatory Visit (INDEPENDENT_AMBULATORY_CARE_PROVIDER_SITE_OTHER): Payer: BC Managed Care – PPO

## 2021-03-19 DIAGNOSIS — M7541 Impingement syndrome of right shoulder: Secondary | ICD-10-CM | POA: Diagnosis not present

## 2021-03-19 NOTE — Assessment & Plan Note (Signed)
Raymond Craig is a pleasant 60 year old male, for the past couple months he has been doing some more exercising including overhead activities, swimming. He started to have pain over his deltoid, on exam he has positive impingement signs including a positive Neer's, Hawkins, empty can sign, positive belly press sign. I think he has rotator cuff tendinitis/shoulder bursitis. We discussed the force coupling function of the rotator cuff, adding some x-rays, he does have meloxicam at home that he will continue, formal physical therapy, avoid overhead activities, return to see me in 6 weeks, subacromial injection if no better.

## 2021-03-19 NOTE — Progress Notes (Signed)
    Procedures performed today:    None.  Independent interpretation of notes and tests performed by another provider:   None.  Brief History, Exam, Impression, and Recommendations:    Impingement syndrome, shoulder, right Raymond Craig is a pleasant 60 year old male, for the past couple months he has been doing some more exercising including overhead activities, swimming. He started to have pain over his deltoid, on exam he has positive impingement signs including a positive Neer's, Hawkins, empty can sign, positive belly press sign. I think he has rotator cuff tendinitis/shoulder bursitis. We discussed the force coupling function of the rotator cuff, adding some x-rays, he does have meloxicam at home that he will continue, formal physical therapy, avoid overhead activities, return to see me in 6 weeks, subacromial injection if no better.    ___________________________________________ [name] [name], M.D., ABFM., CAQSM. Primary Care and [place] Instructor of [place] [facility]

## 2021-03-24 ENCOUNTER — Ambulatory Visit (INDEPENDENT_AMBULATORY_CARE_PROVIDER_SITE_OTHER): Payer: BC Managed Care – PPO | Admitting: Rehabilitative and Restorative Service Providers"

## 2021-03-24 ENCOUNTER — Other Ambulatory Visit: Payer: Self-pay

## 2021-03-24 DIAGNOSIS — M6281 Muscle weakness (generalized): Secondary | ICD-10-CM | POA: Diagnosis not present

## 2021-03-24 DIAGNOSIS — R29898 Other symptoms and signs involving the musculoskeletal system: Secondary | ICD-10-CM | POA: Diagnosis not present

## 2021-03-24 DIAGNOSIS — M25511 Pain in right shoulder: Secondary | ICD-10-CM

## 2021-03-24 NOTE — Therapy (Signed)
[place], [place], [address] Phone: [phone]   Fax:  [phone]  Physical Therapy Evaluation  Patient Details  Name: Raymond Craig MRN: [id number] Date of Birth: [birth year] Referring Provider (PT): Aundria Mems, MD   Encounter Date: 03/24/2021   PT End of Session - 03/24/21 0933     Visit Number 1    Number of Visits 12    Date for PT Re-Evaluation 05/05/21    Authorization Type BCBS *choosing 1x/week due to $52 copay    PT Start Time 0849    PT Stop Time 0932    PT Time Calculation (min) 43 min    Activity Tolerance Patient tolerated treatment well    Behavior During Therapy [facility] for tasks assessed/performed             Past Medical History:  Diagnosis Date   BPH (benign prostatic hypertrophy) with urinary retention 04/18/2013   Erectile dysfunction 04/18/2013   Family history of pancreatic cancer 04/18/2013   Hyperlipidemia 04/18/2013   Prostatitis     Past Surgical History:  Procedure Laterality Date   COLONOSCOPY     lipoma removal  2014   left chest    There were no vitals filed for this visit.    Subjective Assessment - 03/24/21 0847     Subjective The patient began weight training and loaded push ups a couple of months ago.  He has constant shoulder pain inthe superior aspect (near [facility] joint).  He swims 6 days/week 1 mile at a time.  He relies heavily on his arms during swim b/c of a chronic R ankle injury.  The patient is avoiding overhead lifting at this time.    Currently in Pain? Yes    Pain Score 4     Pain Location Shoulder    Pain Orientation Right    Pain Descriptors / Indicators Constant    Pain Type Chronic pain    Pain Onset More than a month ago    Pain Frequency Constant    Aggravating Factors  goes up to 8/10 when reaching overhead    Pain Relieving Factors stretching and warming up in the whirlpool help, meds                [facility] PT  Assessment - 03/24/21 0856       Assessment   Medical Diagnosis R shoulder impingement syndrome    Referring Provider (PT) Aundria Mems, MD    Onset Date/Surgical Date 03/19/21    Hand Dominance Right      Precautions   Precautions None      Restrictions   Weight Bearing Restrictions No      Balance Screen   Has the patient fallen in the past 6 months No    Has the patient had a decrease in activity level because of a fear of falling?  No    Is the patient reluctant to leave their home because of a fear of falling?  No      Home Ec[employer] residence      Prior Function   Level of Independence Independent    Vocation Full time employment    Archivist professor      Observation/Other Assessments   Focus on Therapeutic Outcomes (FOTO)  58%      Sensation   Light Touch Appears Intact      ROM / Strength   AROM / PROM /  Strength AROM;Strength      AROM   Overall AROM  Within functional limits for tasks performed    Overall AROM Comments painful arc of motion.  Pain in deltoid, superior shoulder, and proximal UE    AROM Assessment Site Shoulder    Right/Left Shoulder Right;Left    Right Shoulder Extension 40 Degrees    Right Shoulder Flexion 170 Degrees    Right Shoulder Internal Rotation 68 Degrees   painful and limited; reaches to waist band R and to mid t-spine L   Right Shoulder External Rotation 90 Degrees    Left Shoulder Extension 40 Degrees    Left Shoulder Flexion 170 Degrees    Left Shoulder Internal Rotation --   mid t-spine     Strength   Overall Strength Deficits    Overall Strength Comments 4/5 for flexion and abduction-- pain with abduction; isometric holds ER/IR no pain      Palpation   Spinal mobility TBA-- thoracic CPA-- anticiapte tightness    Palpation comment tender to palpation lateral scapula, supraspinatus, upper trap      Special Tests    Special Tests Rotator Cuff Impingement     Rotator Cuff Impingment tests Lift- off test;Painful Arc of Motion      Lift-Off test   Findings Positive    Side Right    Comment painful IR-- not able to lift off      Painful Arc of Motion   Findings Positive    Side Right                        Objective measurements completed on examination: See above findings.       [place] Adult PT Treatment/Exercise - 03/24/21 1240       Self-Care   Self-Care Other Self-Care Comments    Other Self-Care Comments  STM using a ball for deep pressure      Exercises   Exercises Shoulder      Shoulder Exercises: Standing   External Rotation Strengthening;Right;10 reps    Theraband Level (Shoulder External Rotation) Level 2 (Red)    Internal Rotation Strengthening;Right;10 reps    Theraband Level (Shoulder Internal Rotation) Level 2 (Red)    Extension Strengthening;Both;10 reps    Theraband Level (Shoulder Extension) Level 2 (Red)      Shoulder Exercises: Stretch   Other Shoulder Stretches towel stretch supine for thoracic extension and pec stretch                    PT Education - 03/24/21 0932     Education Details HEP    Person(s) Educated Patient    Methods Explanation;Demonstration    Comprehension Verbalized understanding;Returned demonstration                 PT Long Term Goals - 03/24/21 1300       PT LONG TERM GOAL #1   Title The patient will be indep with HEP.    Time 6    Period Weeks    Target Date 05/05/21      PT LONG TERM GOAL #2   Title The patient will improve functional status score from 58 up to 73%    Time 6    Period Weeks    Target Date 05/05/21      PT LONG TERM GOAL #3   Title The patient will improve R IR ROM to reach to L1 without anterior shoulder pain.  Baseline pain at waist band    Time 6    Period Weeks    Target Date 05/05/21      PT LONG TERM GOAL #4   Title The patient will perform full AROM without pain along arc of motion.    Time 6    Period  Weeks    Target Date 05/05/21      PT LONG TERM GOAL #5   Title The patient will tolerate swimming x 1 minute freestyle stroke without pain.    Time 6    Period Weeks    Target Date 05/05/21                    Plan - 03/24/21 0929     Clinical Impression Statement The patient is a 60 yo male presenting to OP physical therapy with onset of R shoulder pain over past few months.  He presents today with dec'd AROM into IR, painful arc of motion with abduction and flexion, dec'd strength R shoulder, myofascial tightness in R upper trap, pec minor and parascapular musculature; and abnormal posture.  PT to address deficits to return to baseline functional status.    Examination-Activity Limitations Lift;Reach Overhead    Examination-Participation Restrictions Cleaning;Other   fitness/ swimming   Stability/Clinical Decision Making Stable/Uncomplicated    Clinical Decision Making Low    Rehab Potential Good    PT Frequency 1x / week    PT Duration 6 weeks    PT Treatment/Interventions Taping;Patient/family education;Dry needling;Manual techniques;ADLs/Self Care Home Management;Therapeutic activities;Therapeutic exercise;Electrical Stimulation;Moist Heat;Neuromuscular re-education;Passive range of motion    PT Next Visit Plan STM, DN as needed, postural correction to engage posterior shoulder girdle/middle and low trapezius, strengthening at neutral, full can ther ex, stabilization,  Goal to return to swim pain free, eventually be able to increase to overhead lifting.    PT Home Exercise Plan Access Code: [email or web address]    Consulted and Agree with Plan of Care Patient             Patient will benefit from skilled therapeutic intervention in order to improve the following deficits and impairments:  Pain, Hypomobility, Impaired flexibility, Decreased range of motion, Decreased strength, Postural dysfunction  Visit Diagnosis: Acute pain of right shoulder  Muscle weakness  (generalized)  Other symptoms and signs involving the musculoskeletal system     Problem List Patient Active Problem List   Diagnosis Date Noted   Impingement syndrome, shoulder, right 03/19/2021   Benign positional vertigo 07/04/2019   Rupture of peroneal tendon 12/08/2018   Elevated blood pressure reading in office without diagnosis of hypertension 10/26/2018   Right ankle pain [id number]   Complicated UTI (urinary tract infection) 09/15/2016   Insomnia 11/26/2015   Hyperglycemia 11/22/2014   Left nephrolithiasis 02/26/2014   History of colonic polyps 06/30/2013   Hyperlipidemia 04/18/2013   Erectile dysfunction 04/18/2013   Benign prostatic hyperplasia with urinary retention 04/18/2013   Family history of pancreatic cancer 04/18/2013   Rudell Cobb, MPT   [place], PT 03/24/2021, 1:07 PM  [facility] [place], [place], [address] Phone: [phone]   Fax:  [phone]  Name: Raymond Craig MRN: [id number] Date of Birth: 1961/04/23

## 2021-03-24 NOTE — Addendum Note (Signed)
Addended by: Rudell Cobb M on: 03/24/2021 01:10 PM   Modules accepted: Orders

## 2021-03-24 NOTE — Patient Instructions (Signed)
Access Code: [email or web address] URL: [email or web address] Date: 03/24/2021 Prepared by: Rudell Cobb  Program Notes ICE:  20 minutes 2x/day. Morning x 20 minutes either end of the day or after exercise   Exercises Supine Thoracic Mobilization Towel Roll Vertical with Arm Stretch - 2 x daily - 7 x weekly - 1 sets - 1 reps - 2 minutes hold Standing Upper Trapezius Mobilization with Small Ball - 2 x daily - 7 x weekly - 1 sets - 1 reps - 60 seconds hold Standing Pectoral Release with Ball at Addison - 2 x daily - 7 x weekly - 1 sets - 1 reps - 60 seconds hold Shoulder Extension with Resistance Hands Down - 2 x daily - 7 x weekly - 1 sets - 10 reps Shoulder Internal Rotation with Resistance - 2 x daily - 7 x weekly - 1 sets - 10 reps Shoulder External Rotation with Anchored Resistance with Towel Under Elbow - 2 x daily - 7 x weekly - 1 sets - 10 reps

## 2021-03-31 ENCOUNTER — Ambulatory Visit (INDEPENDENT_AMBULATORY_CARE_PROVIDER_SITE_OTHER): Payer: BC Managed Care – PPO | Admitting: Rehabilitative and Restorative Service Providers"

## 2021-03-31 ENCOUNTER — Other Ambulatory Visit: Payer: Self-pay

## 2021-03-31 ENCOUNTER — Encounter: Payer: Self-pay | Admitting: Rehabilitative and Restorative Service Providers"

## 2021-03-31 DIAGNOSIS — M6281 Muscle weakness (generalized): Secondary | ICD-10-CM | POA: Diagnosis not present

## 2021-03-31 DIAGNOSIS — M25511 Pain in right shoulder: Secondary | ICD-10-CM

## 2021-03-31 DIAGNOSIS — R29898 Other symptoms and signs involving the musculoskeletal system: Secondary | ICD-10-CM

## 2021-03-31 NOTE — Therapy (Signed)
[place], [place], [address] Phone: [phone]   Fax:  [phone]  Physical Therapy Treatment  Patient Details  Name: Raymond Craig MRN: [id number] Date of Birth: [birth year] Referring Provider (PT): Aundria Mems, MD   Encounter Date: 03/31/2021   PT End of Session - 03/31/21 0855     Visit Number 2    Number of Visits 12    Date for PT Re-Evaluation 05/05/21    Authorization Type BCBS *choosing 1x/week due to $52 copay    PT Start Time [id number]    PT Stop Time 0931    PT Time Calculation (min) 40 min    Activity Tolerance Patient tolerated treatment well    Behavior During Therapy [facility] for tasks assessed/performed             Past Medical History:  Diagnosis Date   BPH (benign prostatic hypertrophy) with urinary retention 04/18/2013   Erectile dysfunction 04/18/2013   Family history of pancreatic cancer 04/18/2013   Hyperlipidemia 04/18/2013   Prostatitis     Past Surgical History:  Procedure Laterality Date   COLONOSCOPY     lipoma removal  2014   left chest    There were no vitals filed for this visit.   Subjective Assessment - 03/31/21 0853     Subjective The patient reports pain was slightly better, but then he began to paint at home and pain is back to where it was last week.    Patient Stated Goals reduce pain    Currently in Pain? Yes    Pain Score 4     Pain Location Shoulder    Pain Orientation Right    Pain Descriptors / Indicators Constant    Pain Type Chronic pain    Pain Onset More than a month ago    Pain Frequency Constant    Aggravating Factors  painting, swimming    Pain Relieving Factors stretching and meds                [facility] PT Assessment - 03/31/21 0855       Assessment   Medical Diagnosis R shoulder impingement syndrome    Referring Provider (PT) Aundria Mems, MD    Onset Date/Surgical Date 03/19/21    Hand Dominance Right                            [facility] Adult PT Treatment/Exercise - 03/31/21 0855       Exercises   Exercises Shoulder      Shoulder Exercises: Supine   Other Supine Exercises shoulder extension off edge of mat      Shoulder Exercises: Sidelying   Internal Rotation PROM;AROM;Right;5 reps    Internal Rotation Limitations anterior shoulder stretch      Shoulder Exercises: Standing   Other Standing Exercises standing retraction + L's      Shoulder Exercises: ROM/Strengthening   UBE (Upper Arm Bike) UBE L 4 x 2 minutes forward/backward      Shoulder Exercises: Stretch   Corner Stretch Limitations pec doorr frame at 60 and 120 degrees    Wall Stretch - ABduction 1 rep;60 seconds      Modalities   Modalities Moist Heat      Moist Heat Therapy   Number Minutes Moist Heat 10 Minutes    Moist Heat Location Cervical;Shoulder   no charge-- stayed at end of therapy for heat  Manual Therapy   Manual Therapy Soft tissue mobilization    Manual therapy comments skilled palpation to assess response to STM and DN    Soft tissue mobilization STM R biceps, deltoic, upper trap and levator              Trigger Point Dry Needling - 03/31/21 0907     Consent Given? Yes    Education Handout Provided Yes    Muscles Treated Head and Neck Upper trapezius;Levator scapulae    Muscles Treated Upper Quadrant Deltoid;Biceps    Dry Needling Comments right side    Upper Trapezius Response Twitch reponse elicited;Palpable increased muscle length    Levator Scapulae Response Twitch response elicited;Palpable increased muscle length    Deltoid Response Palpable increased muscle length    Biceps Response Palpable increased muscle length                  PT Education - 03/31/21 1313     Education Details Added door frame pec stretch to HEP    Person(s) Educated Patient    Methods Explanation;Demonstration    Comprehension Verbalized understanding;Returned demonstration                  PT Long Term Goals - 03/24/21 1300       PT LONG TERM GOAL #1   Title The patient will be indep with HEP.    Time 6    Period Weeks    Target Date 05/05/21      PT LONG TERM GOAL #2   Title The patient will improve functional status score from 58 up to 73%    Time 6    Period Weeks    Target Date 05/05/21      PT LONG TERM GOAL #3   Title The patient will improve R IR ROM to reach to L1 without anterior shoulder pain.    Baseline pain at waist band    Time 6    Period Weeks    Target Date 05/05/21      PT LONG TERM GOAL #4   Title The patient will perform full AROM without pain along arc of motion.    Time 6    Period Weeks    Target Date 05/05/21      PT LONG TERM GOAL #5   Title The patient will tolerate swimming x 1 minute freestyle stroke without pain.    Time 6    Period Weeks    Target Date 05/05/21                   Plan - 03/31/21 1313     Clinical Impression Statement The patient is continuing to do overhead activities with painting, which may be flaring pain in R shoulder.  He responded well today to dry needling and STM for upper trap and biceps release.  Continue working to [employer].    PT Treatment/Interventions Taping;Patient/family education;Dry needling;Manual techniques;ADLs/Self Care Home Management;Therapeutic activities;Therapeutic exercise;Electrical Stimulation;Moist Heat;Neuromuscular re-education;Passive range of motion    PT Next Visit Plan STM, DN as needed, postural correction to engage posterior shoulder girdle/middle and low trapezius, strengthening at neutral, full can ther ex, stabilization,  Goal to return to swim pain free, eventually be able to increase to overhead lifting.    PT Home Exercise Plan Access Code: [email or web address]    Consulted and Agree with Plan of Care Patient             Patient will benefit from  skilled therapeutic intervention in order to improve the following deficits and impairments:     Visit  Diagnosis: Acute pain of right shoulder  Muscle weakness (generalized)  Other symptoms and signs involving the musculoskeletal system     Problem List Patient Active Problem List   Diagnosis Date Noted   Impingement syndrome, shoulder, right 03/19/2021   Benign positional vertigo 07/04/2019   Rupture of peroneal tendon 12/08/2018   Elevated blood pressure reading in office without diagnosis of hypertension 10/26/2018   Right ankle pain [id number]   Complicated UTI (urinary tract infection) 09/15/2016   Insomnia 11/26/2015   Hyperglycemia 11/22/2014   Left nephrolithiasis 02/26/2014   History of colonic polyps 06/30/2013   Hyperlipidemia 04/18/2013   Erectile dysfunction 04/18/2013   Benign prostatic hyperplasia with urinary retention 04/18/2013   Family history of pancreatic cancer 04/18/2013    Rudell Cobb, PT 03/31/2021, 1:17 PM  [facility] [place], [place], [address] Phone: [phone]   Fax:  [phone]  Name: Raymond Craig MRN: [id number] Date of Birth: Dec 29, 1960

## 2021-04-07 ENCOUNTER — Ambulatory Visit: Payer: BC Managed Care – PPO | Admitting: Rehabilitative and Restorative Service Providers"

## 2021-04-07 ENCOUNTER — Other Ambulatory Visit: Payer: Self-pay

## 2021-04-07 ENCOUNTER — Encounter: Payer: Self-pay | Admitting: Rehabilitative and Restorative Service Providers"

## 2021-04-07 DIAGNOSIS — M6281 Muscle weakness (generalized): Secondary | ICD-10-CM | POA: Diagnosis not present

## 2021-04-07 DIAGNOSIS — R29898 Other symptoms and signs involving the musculoskeletal system: Secondary | ICD-10-CM | POA: Diagnosis not present

## 2021-04-07 DIAGNOSIS — M25511 Pain in right shoulder: Secondary | ICD-10-CM

## 2021-04-07 NOTE — Therapy (Signed)
[place], [place], [address] Phone: [phone]   Fax:  [phone]  Physical Therapy Treatment  Patient Details  Name: Raymond Craig MRN: [id number] Date of Birth: 28-Jun-1961 Referring Provider (PT): Aundria Mems, MD   Encounter Date: 04/07/2021   PT End of Session - 04/07/21 0902     Visit Number 3    Number of Visits 12    Date for PT Re-Evaluation 05/05/21    Authorization Type BCBS *choosing 1x/week due to $52 copay    PT Start Time 0850    PT Stop Time 0930    PT Time Calculation (min) 40 min    Activity Tolerance Patient tolerated treatment well    Behavior During Therapy [facility] for tasks assessed/performed             Past Medical History:  Diagnosis Date   BPH (benign prostatic hypertrophy) with urinary retention 04/18/2013   Erectile dysfunction 04/18/2013   Family history of pancreatic cancer 04/18/2013   Hyperlipidemia 04/18/2013   Prostatitis     Past Surgical History:  Procedure Laterality Date   COLONOSCOPY     lipoma removal  2014   left chest    There were no vitals filed for this visit.   Subjective Assessment - 04/07/21 0849     Subjective The patient reports shoulder is a little better stating "we're making progress."    Patient Stated Goals reduce pain    Currently in Pain? Yes    Pain Score 2     Pain Location Shoulder    Pain Orientation Right    Pain Descriptors / Indicators Constant    Pain Type Chronic pain    Pain Onset More than a month ago    Pain Frequency Constant    Aggravating Factors  swam some last week and finished painting-- overhead activities aggravate    Pain Relieving Factors stretching, meds                [facility] PT Assessment - 04/07/21 0855       Assessment   Medical Diagnosis R shoulder impingement syndrome    Referring Provider (PT) Aundria Mems, MD    Onset Date/Surgical Date 03/19/21    Hand Dominance Right                            [facility] Adult PT Treatment/Exercise - 04/07/21 0855       Exercises   Exercises Shoulder      Shoulder Exercises: Sidelying   Internal Rotation AROM;PROM;Right;5 reps    Internal Rotation Limitations anterior shoulder stretch    ABduction PROM;Right    Other Sidelying Exercises diagonals PROM with pain with adduction cross body      Shoulder Exercises: Standing   Horizontal ABduction Strengthening;Both;10 reps    Theraband Level (Shoulder Horizontal ABduction) Level 3 (Green)    External Rotation Strengthening;Right;10 reps    Theraband Level (Shoulder External Rotation) Level 3 (Green)    Internal Rotation Strengthening;Right;10 reps    Theraband Level (Shoulder Internal Rotation) Level 3 (Green)    Extension Strengthening;Both;10 reps    Extension Limitations extension leads to anterior shoulder pain today-- modified to row      Shoulder Exercises: ROM/Strengthening   UBE (Upper Arm Bike) UBE L 4 x 2 minutes forward/backward      Shoulder Exercises: Stretch   Corner Stretch Limitations pec doorr frame at 60 and 120  degrees    Other Shoulder Stretches retraction with scapular depression to stretch anterior R shoulder      Manual Therapy   Manual Therapy Soft tissue mobilization;Joint mobilization;Myofascial release    Manual therapy comments supine and sidelying    Joint Mobilization AC joint A>P grade II mobs, clavicular mobs, GH inferior glide grade II    Soft tissue mobilization STM R upper trap    Myofascial Release pec release                         PT Long Term Goals - 03/24/21 1300       PT LONG TERM GOAL #1   Title The patient will be indep with HEP.    Time 6    Period Weeks    Target Date 05/05/21      PT LONG TERM GOAL #2   Title The patient will improve functional status score from 58 up to 73%    Time 6    Period Weeks    Target Date 05/05/21      PT LONG TERM GOAL #3   Title The patient  will improve R IR ROM to reach to L1 without anterior shoulder pain.    Baseline pain at waist band    Time 6    Period Weeks    Target Date 05/05/21      PT LONG TERM GOAL #4   Title The patient will perform full AROM without pain along arc of motion.    Time 6    Period Weeks    Target Date 05/05/21      PT LONG TERM GOAL #5   Title The patient will tolerate swimming x 1 minute freestyle stroke without pain.    Time 6    Period Weeks    Target Date 05/05/21                   Plan - 04/07/21 1239     Clinical Impression Statement The had subclavicular myofascial tightness, responding well to clavicle/rib mobilization during stretch.  PT to continue to progress strengthening (not overhead yet), and work to [employer].    PT Treatment/Interventions Taping;Patient/family education;Dry needling;Manual techniques;ADLs/Self Care Home Management;Therapeutic activities;Therapeutic exercise;Electrical Stimulation;Moist Heat;Neuromuscular re-education;Passive range of motion    PT Next Visit Plan STM, DN as needed, postural correction to engage posterior shoulder girdle/middle and low trapezius, strengthening at neutral, full can ther ex, stabilization,  Goal to return to swim pain free, eventually be able to increase to overhead lifting.    PT Home Exercise Plan Access Code: [email or web address]    Consulted and Agree with Plan of Care Patient             Patient will benefit from skilled therapeutic intervention in order to improve the following deficits and impairments:     Visit Diagnosis: Acute pain of right shoulder  Muscle weakness (generalized)  Other symptoms and signs involving the musculoskeletal system     Problem List Patient Active Problem List   Diagnosis Date Noted   Impingement syndrome, shoulder, right 03/19/2021   Benign positional vertigo 07/04/2019   Rupture of peroneal tendon 12/08/2018   Elevated blood pressure reading in office without diagnosis of  hypertension 10/26/2018   Right ankle pain [id number]   Complicated UTI (urinary tract infection) 09/15/2016   Insomnia 11/26/2015   Hyperglycemia 11/22/2014   Left nephrolithiasis 02/26/2014   History of colonic polyps 06/30/2013   Hyperlipidemia 04/18/2013  Erectile dysfunction 04/18/2013   Benign prostatic hyperplasia with urinary retention 04/18/2013   Family history of pancreatic cancer 04/18/2013   Rudell Cobb, MPT  Analea Muller 04/07/2021, 12:42 PM  [facility] [place], [place], [address] Phone: [phone]   Fax:  [phone]  Name: Saurabh Carmona MRN: [id number] Date of Birth: 06-11-1961

## 2021-04-09 ENCOUNTER — Other Ambulatory Visit: Payer: Self-pay | Admitting: Osteopathic Medicine

## 2021-04-09 DIAGNOSIS — M7541 Impingement syndrome of right shoulder: Secondary | ICD-10-CM

## 2021-04-15 ENCOUNTER — Ambulatory Visit (INDEPENDENT_AMBULATORY_CARE_PROVIDER_SITE_OTHER): Payer: BC Managed Care – PPO | Admitting: Rehabilitative and Restorative Service Providers"

## 2021-04-15 ENCOUNTER — Other Ambulatory Visit: Payer: Self-pay

## 2021-04-15 DIAGNOSIS — M25511 Pain in right shoulder: Secondary | ICD-10-CM | POA: Diagnosis not present

## 2021-04-15 DIAGNOSIS — M6281 Muscle weakness (generalized): Secondary | ICD-10-CM | POA: Diagnosis not present

## 2021-04-15 DIAGNOSIS — R29898 Other symptoms and signs involving the musculoskeletal system: Secondary | ICD-10-CM | POA: Diagnosis not present

## 2021-04-15 NOTE — Therapy (Signed)
[place], [place], [address] Phone: [phone]   Fax:  [phone]  Physical Therapy Treatment  Patient Details  Name: Raymond Craig MRN: [id number] Date of Birth: 1961/07/09 Referring Provider (PT): Aundria Mems, MD   Encounter Date: 04/15/2021   PT End of Session - 04/15/21 0845     Visit Number 4    Number of Visits 12    Date for PT Re-Evaluation 05/05/21    Authorization Type BCBS *choosing 1x/week due to $52 copay    PT Start Time 0846    PT Stop Time 0928    PT Time Calculation (min) 42 min    Activity Tolerance Patient tolerated treatment well    Behavior During Therapy [facility] for tasks assessed/performed             Past Medical History:  Diagnosis Date   BPH (benign prostatic hypertrophy) with urinary retention 04/18/2013   Erectile dysfunction 04/18/2013   Family history of pancreatic cancer 04/18/2013   Hyperlipidemia 04/18/2013   Prostatitis     Past Surgical History:  Procedure Laterality Date   COLONOSCOPY     lipoma removal  2014   left chest    There were no vitals filed for this visit.   Subjective Assessment - 04/15/21 0845     Subjective The patient feels like pain intensity is improving and is now a 1/10.  He is aware of it constantly.  Swimming still aggravates pain-- feels more like tightness and soreness versus joint pain.    Patient Stated Goals reduce pain    Currently in Pain? Yes    Pain Score 1     Pain Location Shoulder    Pain Orientation Right    Pain Descriptors / Indicators Constant    Pain Type Chronic pain    Pain Onset More than a month ago    Pain Frequency Constant    Aggravating Factors  swimming, overhead activities    Pain Relieving Factors stretching, meds                [facility] PT Assessment - 04/15/21 0850       Assessment   Medical Diagnosis R shoulder impingement syndrome    Referring Provider (PT) Aundria Mems, MD    Onset Date/Surgical Date 03/19/21    Hand Dominance Right      AROM   Right Shoulder Internal Rotation --   to T10 with minimal tightness                          [facility] Adult PT Treatment/Exercise - 04/15/21 0850       Exercises   Exercises Shoulder      Shoulder Exercises: Supine   Horizontal ABduction Strengthening;Both;12 reps    Diagonals Strengthening;Both;10 reps    Theraband Level (Shoulder Diagonals) Level 3 (Green)      Shoulder Exercises: Prone   Other Prone Exercises quadriped thread the needle and opening into trunk rotation    Other Prone Exercises plank position x 10 seconds (on elbows), extended plank to quadriped position without pain      Shoulder Exercises: Sidelying   Internal Rotation AROM;PROM;Right;5 reps    Internal Rotation Limitations anterior shoulder stretch      Shoulder Exercises: Standing   Horizontal ABduction Strengthening;Both;10 reps    Theraband Level (Shoulder Horizontal ABduction) Level 3 (Green)    External Rotation Strengthening;Right;10 reps    Theraband  Level (Shoulder External Rotation) Level 3 (Green)    External Rotation Limitations arm abducted to 90    Internal Rotation AAROM    Internal Rotation Limitations towel stretch added to HEP    Other Standing Exercises scapular protraction/retraction      Shoulder Exercises: Stretch   Corner Stretch Limitations pec door frame at 60 and 120      Manual Therapy   Manual Therapy Soft tissue mobilization;Joint mobilization    Manual therapy comments supine and sidelying    Joint Mobilization AC joint A>P grade II obs    Soft tissue mobilization subclavicular and pectoralis STM    Myofascial Release pec release                         PT Long Term Goals - 04/15/21 0846       PT LONG TERM GOAL #1   Title The patient will be indep with HEP.    Time 6    Period Weeks    Target Date 05/05/21      PT LONG TERM GOAL #2   Title  The patient will improve functional status score from 58 up to 73%    Time 6    Period Weeks      PT LONG TERM GOAL #3   Title The patient will improve R IR ROM to reach to L1 without anterior shoulder pain.    Baseline pain at waist band at eval-- now can reach to thoracic spine-- does get mild pain at end range    Time 6    Period Weeks    Status Achieved      PT LONG TERM GOAL #4   Title The patient will perform full AROM without pain along arc of motion.    Time 6    Period Weeks    Status Achieved      PT LONG TERM GOAL #5   Title The patient will tolerate swimming x 10 minute freestyle stroke without pain.    Time 6    Period Weeks                   Plan - 04/15/21 1010     Clinical Impression Statement The patient is continuing to make progress with dec'd intensity level of pain.  He is now at 1/10 level.  He continues with increased pain when swimming-- he tolerates free style better than breast stroke or backstroke.  PT plans to continue working to [place].    PT Treatment/Interventions Taping;Patient/family education;Dry needling;Manual techniques;ADLs/Self Care Home Management;Therapeutic activities;Therapeutic exercise;Electrical Stimulation;Moist Heat;Neuromuscular re-education;Passive range of motion    PT Next Visit Plan STM, DN as needed, postural correction to engage posterior shoulder girdle/middle and low trapezius, strengthening at neutral, full can ther ex, stabilization,  Goal to return to swim pain free, eventually be able to increase to overhead lifting.  *focus on return to all swim strokes (breast stroke and back stroke more painful*    PT Home Exercise Plan Access Code: [email or web address]    Consulted and Agree with Plan of Care Patient             Patient will benefit from skilled therapeutic intervention in order to improve the following deficits and impairments:     Visit Diagnosis: Acute pain of right shoulder  Muscle weakness  (generalized)  Other symptoms and signs involving the musculoskeletal system     Problem List Patient Active Problem List  Diagnosis Date Noted   Impingement syndrome, shoulder, right 03/19/2021   Benign positional vertigo 07/04/2019   Rupture of peroneal tendon 12/08/2018   Elevated blood pressure reading in office without diagnosis of hypertension 10/26/2018   Right ankle pain [id number]   Complicated UTI (urinary tract infection) 09/15/2016   Insomnia 11/26/2015   Hyperglycemia 11/22/2014   Left nephrolithiasis 02/26/2014   History of colonic polyps 06/30/2013   Hyperlipidemia 04/18/2013   Erectile dysfunction 04/18/2013   Benign prostatic hyperplasia with urinary retention 04/18/2013   Family history of pancreatic cancer 04/18/2013   Rudell Cobb, MPT  Tyrihanna Wingert 04/15/2021, 3:29 PM  [facility] [place], [place], [address] Phone: [phone]   Fax:  [phone]  Name: Raymond Craig MRN: [id number] Date of Birth: 10-Sep-1960

## 2021-04-15 NOTE — Patient Instructions (Signed)
Access Code: [email or web address] URL: [email or web address] Date: 04/15/2021 Prepared by: Rudell Cobb  Program Notes ICE:  20 minutes 2x/day. Morning x 20 minutes either end of the day or after exercise   Exercises Doorway Pec Stretch at 60 Elevation - 2 x daily - 7 x weekly - 1 sets - 3 reps - 30 seconds hold Doorway Pec Stretch at 120 Degrees Abduction - 2 x daily - 7 x weekly - 1 sets - 3 reps - 30 seconds hold Standing Bicep Stretch at Wall - 2 x daily - 7 x weekly - 1 sets - 3 reps - 30 seconds hold Standing Upper Trapezius Mobilization with Small Ball - 1 x daily - 7 x weekly - 1 sets - 1 reps - 60 seconds hold Standing Shoulder Internal Rotation Stretch with Towel - 1 x daily - 7 x weekly - 1 sets - 2-3 reps - 20-30 seconds hold Standing Shoulder Row with Anchored Resistance - 1 x daily - 7 x weekly - 2 sets - 15 reps Standing Single Arm Shoulder External Rotation in Abduction with Anchored Resistance - 1 x daily - 7 x weekly - 1 sets - 10 reps Supine Shoulder Horizontal Abduction with Resistance - 1 x daily - 7 x weekly - 1 sets - 15 reps Scaption with Dumbbells - 1 x daily - 7 x weekly - 2 sets - 15 reps First Rib Mobilization with Strap - 1 x daily - 7 x weekly - 1 sets - 2-3 reps - 30 seconds hold

## 2021-04-21 ENCOUNTER — Other Ambulatory Visit: Payer: Self-pay

## 2021-04-21 ENCOUNTER — Ambulatory Visit (INDEPENDENT_AMBULATORY_CARE_PROVIDER_SITE_OTHER): Payer: BC Managed Care – PPO | Admitting: Rehabilitative and Restorative Service Providers"

## 2021-04-21 DIAGNOSIS — M6281 Muscle weakness (generalized): Secondary | ICD-10-CM

## 2021-04-21 DIAGNOSIS — R29898 Other symptoms and signs involving the musculoskeletal system: Secondary | ICD-10-CM

## 2021-04-21 DIAGNOSIS — M25511 Pain in right shoulder: Secondary | ICD-10-CM

## 2021-04-21 NOTE — Therapy (Signed)
[place], [place], [address] Phone: [phone]   Fax:  [phone]  Physical Therapy Treatment  Patient Details  Name: Raymond Craig MRN: [id number] Date of Birth: March 15, 1961 Referring Provider (PT): Aundria Mems, MD   Encounter Date: 04/21/2021   PT End of Session - 04/21/21 0851     Visit Number 5    Number of Visits 12    Date for PT Re-Evaluation 05/05/21    Authorization Type BCBS *choosing 1x/week due to $52 copay    PT Start Time 1517    PT Stop Time 0928    PT Time Calculation (min) 41 min    Activity Tolerance Patient tolerated treatment well    Behavior During Therapy [facility] for tasks assessed/performed             Past Medical History:  Diagnosis Date   BPH (benign prostatic hypertrophy) with urinary retention 04/18/2013   Erectile dysfunction 04/18/2013   Family history of pancreatic cancer 04/18/2013   Hyperlipidemia 04/18/2013   Prostatitis     Past Surgical History:  Procedure Laterality Date   COLONOSCOPY     lipoma removal  2014   left chest    There were no vitals filed for this visit.   Subjective Assessment - 04/21/21 0849     Subjective The patient notes some flaring up this morning.    Patient Stated Goals reduce pain    Currently in Pain? Yes    Pain Score 2     Pain Location Shoulder    Pain Orientation Right    Pain Descriptors / Indicators Discomfort    Pain Type Chronic pain    Pain Onset More than a month ago    Pain Frequency Constant    Aggravating Factors  unsure what flared it today; swimming has not been hurting or he stops    Pain Relieving Factors stretching, meds                [facility] PT Assessment - 04/21/21 0852       Assessment   Medical Diagnosis R shoulder impingement syndrome    Referring Provider (PT) Aundria Mems, MD    Onset Date/Surgical Date 03/19/21    Hand Dominance Right                            [facility] Adult PT Treatment/Exercise - 04/21/21 0852       Exercises   Exercises Shoulder      Shoulder Exercises: Prone   Retraction Strengthening;Both;12 reps    Flexion Strengthening;Both;12 reps    Horizontal ABduction 1 Strengthening;Both;12 reps    Other Prone Exercises prone scapular depression with lumbar extension      Shoulder Exercises: Sidelying   Internal Rotation AROM;Right;5 reps;PROM    Internal Rotation Limitations anterior shoulder stretch    Other Sidelying Exercises sleeper stretch      Shoulder Exercises: Standing   Extension Strengthening;Both;10 reps;AAROM      Shoulder Exercises: ROM/Strengthening   UBE (Upper Arm Bike) UBE L 4 x 2 minutes forward/backward      Shoulder Exercises: Stretch   Corner Stretch Limitations pec door frame at 60 and 120      Manual Therapy   Manual Therapy Soft tissue mobilization;Myofascial release    Manual therapy comments supine and sidelying; also performed seated taping for postural feedback (anterior shoulder to X posteriorly)    Soft tissue mobilization  subclavicular and pectoralis STM    Myofascial Release pec release                         PT Long Term Goals - 04/15/21 0846       PT LONG TERM GOAL #1   Title The patient will be indep with HEP.    Time 6    Period Weeks    Target Date 05/05/21      PT LONG TERM GOAL #2   Title The patient will improve functional status score from 58 up to 73%    Time 6    Period Weeks      PT LONG TERM GOAL #3   Title The patient will improve R IR ROM to reach to L1 without anterior shoulder pain.    Baseline pain at waist band at eval-- now can reach to thoracic spine-- does get mild pain at end range    Time 6    Period Weeks    Status Achieved      PT LONG TERM GOAL #4   Title The patient will perform full AROM without pain along arc of motion.    Time 6    Period Weeks    Status Achieved      PT LONG TERM GOAL #5    Title The patient will tolerate swimming x 10 minute freestyle stroke without pain.    Time 6    Period Weeks                   Plan - 04/21/21 5573     Clinical Impression Statement The patient has met 2 LTGs.  His R shoulder was more "flared" today to a 2/10, with patient report that AAROM IR towel stretch aggravates pain.  PT modified stretch and also added prone scapular/posterior shoulder girdle strengthening for swimming specific training.  Patient tolerates with no increase in pain, but notes fatigue.  He continues with myofascial tightness in distal subclavicular region into pecs.  Patient is progressing with exercising and will alternate between prone scapular strengthening and theraband/scaption activities.    PT Treatment/Interventions Taping;Patient/family education;Dry needling;Manual techniques;ADLs/Self Care Home Management;Therapeutic activities;Therapeutic exercise;Electrical Stimulation;Moist Heat;Neuromuscular re-education;Passive range of motion    PT Next Visit Plan STM, DN as needed, postural correction to engage posterior shoulder girdle/middle and low trapezius, strengthening at neutral, full can ther ex, stabilization,  *focus on return to all swim strokes (breast stroke and back stroke more painful*    PT Home Exercise Plan Access Code: [email or web address]    Consulted and Agree with Plan of Care Patient             Patient will benefit from skilled therapeutic intervention in order to improve the following deficits and impairments:     Visit Diagnosis: Acute pain of right shoulder  Muscle weakness (generalized)  Other symptoms and signs involving the musculoskeletal system     Problem List Patient Active Problem List   Diagnosis Date Noted   Impingement syndrome, shoulder, right 03/19/2021   Benign positional vertigo 07/04/2019   Rupture of peroneal tendon 12/08/2018   Elevated blood pressure reading in office without diagnosis of hypertension 10/26/2018    Right ankle pain 22/09/5425   Complicated UTI (urinary tract infection) 09/15/2016   Insomnia 11/26/2015   Hyperglycemia 11/22/2014   Left nephrolithiasis 02/26/2014   History of colonic polyps 06/30/2013   Hyperlipidemia 04/18/2013   Erectile dysfunction 04/18/2013   Benign prostatic  hyperplasia with urinary retention 04/18/2013   Family history of pancreatic cancer 04/18/2013   Rudell Cobb PT, MPT  Kissy Cielo 04/21/2021, 9:50 AM  [facility] [place], [place], [address] Phone: [phone]   Fax:  [phone]  Name: Raymond Craig MRN: [id number] Date of Birth: 1961/03/06

## 2021-04-21 NOTE — Patient Instructions (Signed)
Access Code: [email or web address] URL: [email or web address] Date: 04/21/2021 Prepared by: Rudell Cobb  Program Notes ICE:  20 minutes 2x/day. Morning x 20 minutes either end of the day or after exercise   Exercises Doorway Pec Stretch at 60 Elevation - 2 x daily - 7 x weekly - 1 sets - 3 reps - 30 seconds hold Doorway Pec Stretch at 120 Degrees Abduction - 2 x daily - 7 x weekly - 1 sets - 3 reps - 30 seconds hold Standing Bicep Stretch at [place] - 2 x daily - 7 x weekly - 1 sets - 3 reps - 30 seconds hold Standing Shoulder Row with Anchored Resistance - 1 x daily - 7 x weekly - 2 sets - 15 reps Standing Single Arm Shoulder External Rotation in Abduction with Anchored Resistance - 1 x daily - 7 x weekly - 1 sets - 10 reps Supine Shoulder Horizontal Abduction with Resistance - 1 x daily - 7 x weekly - 1 sets - 15 reps Scaption with Dumbbells - 1 x daily - 7 x weekly - 2 sets - 15 reps First Rib Mobilization with Strap - 1 x daily - 7 x weekly - 1 sets - 2-3 reps - 30 seconds hold Prone W Scapular Retraction - 1 x daily - 7 x weekly - 1 sets - 10 reps Prone Shoulder Flexion - 1 x daily - 7 x weekly - 1 sets - 10 reps Superman on Table - 1 x daily - 7 x weekly - 1 sets - 10 reps Sleeper Stretch - 1 x daily - 7 x weekly - 1 sets - 2 reps - 30 seconds hold  Patient Education Kinesiology tape

## 2021-04-30 ENCOUNTER — Other Ambulatory Visit: Payer: Self-pay

## 2021-04-30 ENCOUNTER — Ambulatory Visit: Payer: BC Managed Care – PPO | Admitting: Sports Medicine

## 2021-04-30 ENCOUNTER — Ambulatory Visit (INDEPENDENT_AMBULATORY_CARE_PROVIDER_SITE_OTHER): Payer: BC Managed Care – PPO

## 2021-04-30 DIAGNOSIS — G8929 Other chronic pain: Secondary | ICD-10-CM

## 2021-04-30 DIAGNOSIS — M25571 Pain in right ankle and joints of right foot: Secondary | ICD-10-CM

## 2021-04-30 DIAGNOSIS — M7541 Impingement syndrome of right shoulder: Secondary | ICD-10-CM

## 2021-04-30 NOTE — Progress Notes (Signed)
    Procedures performed today:    None.  Independent interpretation of notes and tests performed by another provider:   None.  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 We did get an opinion from orthopedic foot and ankle surgery and he has had to surgical procedures to repair his peroneals, the next step from a surgical standpoint is cadaver graft for peroneal tendon. Clinically he has tenderness behind the lateral malleolus, pain with resisted eversion, I would like another x-ray, MRI, we added a lateral wedge. Unable to use nitroglycerin as he does take nitrates. At the follow-up visit we will consider injection into the peroneal tendon sheath versus PRP.  Impingement syndrome, shoulder, right 90% better with physical therapy, return as needed.    ___________________________________________ [name] [name], M.D., ABFM., CAQSM. Primary Care and [place] Instructor of [place] [facility] of Medicine

## 2021-04-30 NOTE — Assessment & Plan Note (Signed)
90% better with physical therapy, return as needed.

## 2021-04-30 NOTE — Assessment & Plan Note (Signed)
Raymond Craig is a pleasant 60 year old male, he had an ankle injury back in March 2019, ultimately found to have complete tears of the ATFL, CFL, longitudinal split tear of the peroneus brevis, after casting, boot, physical therapy he was given a tibiotalar joint injection under ultrasound guidance which did not give him much relief. We did get an opinion from orthopedic foot and ankle surgery and he has had to surgical procedures to repair his peroneals, the next step from a surgical standpoint is cadaver graft for peroneal tendon. Clinically he has tenderness behind the lateral malleolus, pain with resisted eversion, I would like another x-ray, MRI, we added a lateral wedge. Unable to use nitroglycerin as he does take nitrates. At the follow-up visit we will consider injection into the peroneal tendon sheath versus PRP.

## 2021-05-04 ENCOUNTER — Ambulatory Visit (INDEPENDENT_AMBULATORY_CARE_PROVIDER_SITE_OTHER): Payer: BC Managed Care – PPO

## 2021-05-04 ENCOUNTER — Other Ambulatory Visit: Payer: Self-pay

## 2021-05-04 ENCOUNTER — Other Ambulatory Visit: Payer: BC Managed Care – PPO

## 2021-05-04 DIAGNOSIS — G8929 Other chronic pain: Secondary | ICD-10-CM

## 2021-05-04 DIAGNOSIS — M25571 Pain in right ankle and joints of right foot: Secondary | ICD-10-CM

## 2021-05-05 ENCOUNTER — Ambulatory Visit (INDEPENDENT_AMBULATORY_CARE_PROVIDER_SITE_OTHER): Payer: BC Managed Care – PPO | Admitting: Rehabilitative and Restorative Service Providers"

## 2021-05-05 DIAGNOSIS — M6281 Muscle weakness (generalized): Secondary | ICD-10-CM | POA: Diagnosis not present

## 2021-05-05 DIAGNOSIS — R29898 Other symptoms and signs involving the musculoskeletal system: Secondary | ICD-10-CM

## 2021-05-05 DIAGNOSIS — M25511 Pain in right shoulder: Secondary | ICD-10-CM

## 2021-05-05 NOTE — Therapy (Signed)
[place], [place], [address] Phone: [phone]   Fax:  [phone]  Physical Therapy Treatment  Patient Details  Name: Raymond Craig MRN: [id number] Date of Birth: [birth year] Referring Provider (PT): Aundria Mems, MD   Encounter Date: 05/05/2021   PT End of Session - 05/05/21 1225     Visit Number 6    Number of Visits 12    Date for PT Re-Evaluation 05/05/21    Authorization Type BCBS *choosing 1x/week due to $52 copay    PT Start Time 0848    PT Stop Time 0930    PT Time Calculation (min) 42 min    Activity Tolerance Patient tolerated treatment well    Behavior During Therapy [facility] for tasks assessed/performed             Past Medical History:  Diagnosis Date   BPH (benign prostatic hypertrophy) with urinary retention 04/18/2013   Erectile dysfunction 04/18/2013   Family history of pancreatic cancer 04/18/2013   Hyperlipidemia 04/18/2013   Prostatitis     Past Surgical History:  Procedure Laterality Date   COLONOSCOPY     lipoma removal  2014   left chest    There were no vitals filed for this visit.   Subjective Assessment - 05/05/21 0848     Subjective Pain is significantly better.  The patient reports that he is swimming fo r1-1.5 miles with mild discomfort.                [facility] PT Assessment - 05/05/21 0931       Assessment   Medical Diagnosis R shoulder impingement syndrome    Referring Provider (PT) Aundria Mems, MD    Onset Date/Surgical Date 03/19/21    Hand Dominance Right                           [facility] Adult PT Treatment/Exercise - 05/05/21 0931       Exercises   Exercises Shoulder      Shoulder Exercises: Standing   Horizontal ABduction Strengthening;Both;10 reps;Theraband    Theraband Level (Shoulder Horizontal ABduction) Level 2 (Red)    Horizontal ABduction Limitations Moved to Red (from green) because we progressed  to T's from anchored band    External Rotation Strengthening;Right;10 reps    Theraband Level (Shoulder External Rotation) Level 2 (Red)    External Rotation Limitations arm abducted to 90    Extension Strengthening;Both;10 reps;AAROM    Theraband Level (Shoulder Extension) Level 2 (Red)    Extension Limitations no pain today-- easy to perform-- did not add to HEP    Row Strengthening;Both;10 reps    Theraband Level (Shoulder Row) Level 2 (Red)    Row Limitations easier and no longer needed for HEP at neutral--- progressed to high rows    Retraction Strengthening;Both;10 reps    Theraband Level (Shoulder Retraction) Level 2 (Red)    Retraction Limitations "Y's for theraband    Other Standing Exercises high row, ER, T, Y, I (too easy),    Other Standing Exercises prone shoulder stretch coracoid,,    tricep and bicep strengthening.      Shoulder Exercises: ROM/Strengthening   UBE (Upper Arm Bike) UBE L 4 x 2 minutes forward/backward      Shoulder Exercises: Stretch   Corner Stretch Limitations pec door frame at 60 and 120    Other Shoulder Stretches Prone deep anterior shoulder stretch with PT providing  rotational pull to shoulder (also stretches thoracic spine)    Other Shoulder Stretches prone on elbows with lateral rocking                     PT Education - 05/05/21 0923     Education Details HEP    Person(s) Educated Patient    Methods Explanation;Demonstration    Comprehension Verbalized understanding;Returned demonstration                 PT Long Term Goals - 05/05/21 1236       PT LONG TERM GOAL #1   Title The patient will be indep with HEP.    Time 6    Period Weeks    Status On-going      PT LONG TERM GOAL #2   Title The patient will improve functional status score from 58 up to 73%    Time 6    Period Weeks    Status On-going      PT LONG TERM GOAL #3   Title The patient will improve R IR ROM to reach to L1 without anterior shoulder pain.     Baseline pain at waist band at eval-- now can reach to thoracic spine-- does get mild pain at end range    Time 6    Period Weeks    Status Achieved      PT LONG TERM GOAL #4   Title The patient will perform full AROM without pain along arc of motion.    Time 6    Period Weeks    Status Achieved      PT LONG TERM GOAL #5   Title The patient will tolerate swimming x 10 minute freestyle stroke without pain.    Time 6    Period Weeks    Status On-going                   Plan - 05/05/21 1236     Clinical Impression Statement The patient is continuing to progress through HEP and requested a comprehensive UE program.  Today's session focused on progressing strengthening activities for posterior shoulder girdle.  Patient to follow-up in 2 weeks to check on progress.  PT will renew plan of care for that visit and determine if further check in needed.    PT Treatment/Interventions Taping;Patient/family education;Dry needling;Manual techniques;ADLs/Self Care Home Management;Therapeutic activities;Therapeutic exercise;Electrical Stimulation;Moist Heat;Neuromuscular re-education;Passive range of motion    PT Next Visit Plan STM, DN as needed, postural correction to engage posterior shoulder girdle/middle and low trapezius, strengthening at neutral, full can ther ex, stabilization,  *focus on return to all swim strokes (breast stroke and back stroke more painful*    PT Home Exercise Plan Access Code: [email or web address]    Consulted and Agree with Plan of Care Patient             Patient will benefit from skilled therapeutic intervention in order to improve the following deficits and impairments:     Visit Diagnosis: Acute pain of right shoulder  Muscle weakness (generalized)  Other symptoms and signs involving the musculoskeletal system     Problem List Patient Active Problem List   Diagnosis Date Noted   Impingement syndrome, shoulder, right 03/19/2021   Benign positional  vertigo 07/04/2019   Rupture of peroneal tendon 12/08/2018   Elevated blood pressure reading in office without diagnosis of hypertension 10/26/2018   Right ankle pain [id number]   Complicated UTI (urinary tract infection) 09/15/2016  Insomnia 11/26/2015   Hyperglycemia 11/22/2014   Left nephrolithiasis 02/26/2014   History of colonic polyps 06/30/2013   Hyperlipidemia 04/18/2013   Erectile dysfunction 04/18/2013   Benign prostatic hyperplasia with urinary retention 04/18/2013   Family history of pancreatic cancer 04/18/2013    Camdin Hegner, PT 05/05/2021, 12:42 PM  [place], [place], [address] Phone: [phone]   Fax:  [phone]  Name: Raymond Craig MRN: [id number] Date of Birth: 05-Nov-1960

## 2021-05-05 NOTE — Patient Instructions (Signed)
Access Code: [email or web address] URL: [email or web address] Date: 05/05/2021 Prepared by: Rudell Cobb  Program Notes ICE:  20 minutes 2x/day. Morning x 20 minutes either end of the day or after exercise   Exercises First Rib Mobilization with Strap - 1 x daily - 7 x weekly - 1 sets - 2-3 reps - 30 seconds hold Doorway Pec Stretch at 60 Elevation - 2 x daily - 7 x weekly - 1 sets - 3 reps - 30 seconds hold Doorway Pec Stretch at 120 Degrees Abduction - 2 x daily - 7 x weekly - 1 sets - 3 reps - 30 seconds hold Standing Bicep Stretch at [place] - 2 x daily - 7 x weekly - 1 sets - 3 reps - 30 seconds hold Single Arm Overhead Triceps Extension - 1 x daily - 7 x weekly - 1 sets - 10 reps Scaption with Dumbbells - 1 x daily - 7 x weekly - 2 sets - 15 reps Standing Single Arm Shoulder External Rotation in Abduction with Anchored Resistance - 1 x daily - 7 x weekly - 1 sets - 10 reps Standing Shoulder Horizontal Abduction with Anchored Resistance - 1 x daily - 7 x weekly - 1 sets - 10-12 reps Standing Low Trap Setting with Resistance at Wall - 1 x daily - 7 x weekly - 1 sets - 10 reps Standing Row with Resistance with Anchored Resistance at Chest Height Palms Down - 1 x daily - 7 x weekly - 1 sets - 10 reps Superman on Table - 1 x daily - 7 x weekly - 1 sets - 10 reps Sleeper Stretch - 1 x daily - 7 x weekly - 1 sets - 2 reps - 30 seconds hold

## 2021-05-09 ENCOUNTER — Other Ambulatory Visit: Payer: Self-pay | Admitting: Osteopathic Medicine

## 2021-05-09 DIAGNOSIS — N529 Male erectile dysfunction, unspecified: Secondary | ICD-10-CM

## 2021-05-10 ENCOUNTER — Other Ambulatory Visit: Payer: Self-pay | Admitting: Osteopathic Medicine

## 2021-05-10 DIAGNOSIS — M7541 Impingement syndrome of right shoulder: Secondary | ICD-10-CM

## 2021-05-12 ENCOUNTER — Other Ambulatory Visit: Payer: Self-pay | Admitting: Osteopathic Medicine

## 2021-05-12 DIAGNOSIS — G4700 Insomnia, unspecified: Secondary | ICD-10-CM

## 2021-05-13 ENCOUNTER — Ambulatory Visit: Payer: BC Managed Care – PPO | Admitting: Sports Medicine

## 2021-05-13 DIAGNOSIS — G8929 Other chronic pain: Secondary | ICD-10-CM | POA: Diagnosis not present

## 2021-05-13 DIAGNOSIS — M25571 Pain in right ankle and joints of right foot: Secondary | ICD-10-CM

## 2021-05-13 NOTE — Assessment & Plan Note (Addendum)
Raymond Craig returns again he, he is a very pleasant 60 year old male, ankle injury back in March 2019, ultimately found to have complete tears of the ATFL, CFL, and a longitudinal split tear of the peroneus brevis, after casting, boot, physical therapy, a tibiotalar joint injection was not efficacious, ultimately he had a couple of operations to repair his peroneals. Still has tenderness behind and distal to the lateral malleolus along the peroneus brevis and longus tendons. We obtained an MRI that did show significant thickening and tendinopathy/tendinosis of the peroneus longus. We were unable to use topical nitroglycerin as he does take nitrates. Lateral wedge and activity modification has not been efficacious, at this juncture we are going to proceed with PRP percutaneous tenotomy of the peroneus longus and brevis behind and distal to the lateral malleolus, he will come back at a future appointment for this.  Anticipatory guidance and medications to stop were discussed. He does understand this will necessitate a short course of narcotics, boot immobilization afterwards and then physical therapy, if all of the above fails he will be a candidate for cadaver graft.

## 2021-05-13 NOTE — Progress Notes (Signed)
    Procedures performed today:    None.  Independent interpretation of notes and tests performed by another provider:   None.  Brief History, Exam, Impression, and Recommendations:    Right peroneal tendinopathy postsurgical repair Raymond Craig returns again he, he is a very pleasant 60 year old male, ankle injury back in March 2019, ultimately found to have complete tears of the ATFL, CFL, and a longitudinal split tear of the peroneus brevis, after casting, boot, physical therapy, a tibiotalar joint injection was not efficacious, ultimately he had a couple of operations to repair his peroneals. Still has tenderness behind and distal to the lateral malleolus along the peroneus brevis and longus tendons. We obtained an MRI that did show significant thickening and tendinopathy/tendinosis of the peroneus longus. We were unable to use topical nitroglycerin as he does take nitrates. Lateral wedge and activity modification has not been efficacious, at this juncture we are going to proceed with PRP percutaneous tenotomy of the peroneus longus and brevis behind and distal to the lateral malleolus, he will come back at a future appointment for this.  Anticipatory guidance and medications to stop were discussed. He does understand this will necessitate a short course of narcotics, boot immobilization afterwards and then physical therapy, if all of the above fails he will be a candidate for cadaver graft.    ___________________________________________ [name] [name], M.D., ABFM., CAQSM. Primary Care and [place] Instructor of [employer] [facility] [employer]

## 2021-05-13 NOTE — Patient Instructions (Signed)
? ?Platelet-rich plasma is used in musculoskeletal medicine to focus your own body?s ability to ?heal. It has several well-done published randomized control trials (RCT) which demonstrate both ?its effectiveness and safety in many musculoskeletal conditions, including osteoarthritis, ?tendinopathies, and damaged vertebral discs. PRP has been in clinical use since the 1990?s. ?Many people know that platelets form a clot if there is a cut in the skin. It turns out that ?platelets do not only form a clot, they also start the body?s own repair process. When platelets ?activate to form a clot, they also release alpha granules which have hundreds of chemical ?messengers in them that initiate and organize repair to the damaged tissue. Precisely placing ?PRP into the site of injury will initiate the healing process by activating on the damaged ?cartilage or tendon. This is an inflammatory process, and inflammation is the vital first phase of ?Healing. ? ?What to expect and how to prepare for PRP ? ? 2 weeks prior to the procedure: depending on the procedure, you may need to arrange ?for a driver to bring you home. IF you are having a lower extremity procedure, we can provide crutches ?as needed. ? ? 7 days prior to the procedure: Stop taking anti-inflammatory drugs like ibuprofen, ?Naprosyn, Celebrex, or Meloxicam. Let Dr. Trinadee Verhagen know if you have been taking prednisone or other ?corticosteroids in the last month. ? ? The day before the procedure: thoroughly shower and clean your skin.  ? ? The day of the procedure: Wear loose-fitting clothing like sweatpants or shorts. If you ?are having an upper body procedure wear a top that can button or zip up. ? ?PRP will initiate healing and a productive inflammation, and PRP therapy will make the body ?part treated sore for 4 days to two weeks. Anti-inflammatory drugs (i.e. ibuprofen, Naprosyn, ?Celebrex) and corticosteroids such as prednisone can blunt or stop this process,  so it is ?important to not take any anti-inflammatory drugs for 7 days before getting PRP therapy, or for ?at least three weeks after PRP therapy. Corticosteroid injections can blunt inflammation for 30 ?days, so let us know if you have had one recently. Depending on the body part injected, you ?may be in a sling or on crutches for several days. Just like wringing out a wet dishcloth, if you ?load or tense a tendon or ligament that has just been injected with PRP, some of the PRP ?injected will squish out. By keeping the body part treated relaxed by using a sling (for the ?shoulder or arm) or crutches (for hips and legs) for a few days, the PRP can bind in place and do ?its job.  ? ?You may need a driver to bring you home.  ?Tobacco/nicotine is a potent toxin and its use constricts small blood vessels which are needed for tissue repair.  ?Tobacco/nicotine use will limit the effectiveness of any treatment and stopping tobacco use is one of the single  ?greatest actions you can take to improve your health. Avoid toxins like alcohol, which inhibits and depresses the ?cells needed for tissue repair. ? ?What happens during the PRP procedure? ? ?Platelet rich plasma is made by taking some of your blood and performing a two-stage ?centrifuge process on it to concentrate the PRP. First, your blood is drawn into a syringe with a ?small amount of anti-coagulant in it (this is to keep the blood from clotting during this process). ?The amount of blood drawn is usually about 10-30 milliliters, depending on how much PRP is needed for   the treatment.  ?(There are 355 milliliters in a 12-ounce soda can for comparison).  ?Then the blood is transferred in a sterile fashion into a ?centrifuge tube. It is then centrifuged for the first cycle where the red blood cells are isolated ?and discarded. In the second centrifuge cycle, the platelet-rich fraction of the remaining plasma ?is concentrated and placed in a syringe. The skin at the  injection site is numbed with a small ?amount of topical cooling spray. Dr Brenee Gajda will then precisely inject the PRP ?into the injury site using ultrasound guidance. ? ?What to do after your procedure ? ?I will give you specific medicine to control any discomfort you may have after the procedure. ?Avoid NSAIDs like ibuprofen. ?Acetaminophen can be used for mild pain.  ?Depending on the part of the body ?treated, usually you will be placed in a sling or on crutches for 1 to 3 days. Do your best not to ?tense or load the treated area during this time. After 3 days, unless otherwise instructed, the ?treated body part should be used and slowly moved through its full range of motion. It will be ?sore, but you will not be doing damage by moving it, in fact it needs to move to heal. If you ?were on crutches for a period of time, walking is ok once you are off the crutches. For now, ?avoid activities that specifically hurt you before being treated. Exercise is vital to good health ?and finding a way to cross train around your injury is important not only for your physical ?health, but for your mental health as well. Ask me about cross training options for your injury. ?Some brief (10 minutes or less) period of heat or ice therapy will not hurt the therapy, but it is ?not required. Usually, depending on the initial injury, physical therapy is started from two ?weeks to four weeks after injection. Improvements in pain and function should be expected ?from 8 weeks to 12 weeks after injection and some injuries may require more than one ?treatment.  ? ? ?___________________________________________ ?Raymond Craig J. Raymond Craig, M.D., ABFM., CAQSM. ?Primary Care and Sports Medicine ?[facility] ? ?Adjunct Professor of Family Medicine  ?[employer] ? ?

## 2021-05-19 ENCOUNTER — Ambulatory Visit (INDEPENDENT_AMBULATORY_CARE_PROVIDER_SITE_OTHER): Payer: BC Managed Care – PPO | Admitting: Rehabilitative and Restorative Service Providers"

## 2021-05-19 ENCOUNTER — Other Ambulatory Visit: Payer: Self-pay

## 2021-05-19 DIAGNOSIS — R29898 Other symptoms and signs involving the musculoskeletal system: Secondary | ICD-10-CM

## 2021-05-19 DIAGNOSIS — M6281 Muscle weakness (generalized): Secondary | ICD-10-CM

## 2021-05-19 DIAGNOSIS — M25511 Pain in right shoulder: Secondary | ICD-10-CM | POA: Diagnosis not present

## 2021-05-19 NOTE — Therapy (Signed)
[place], [place], [address] Phone: [phone]   Fax:  [phone]  Physical Therapy Treatment and Discharge Summary  Patient Details  Name: Raymond Craig MRN: [id number] Date of Birth: [birth year] Referring Provider (PT): Aundria Mems, MD   Encounter Date: 05/19/2021   PT End of Session - 05/19/21 0807     Visit Number 7    Number of Visits 12    Date for PT Re-Evaluation 05/05/21    Authorization Type BCBS *choosing 1x/week due to $52 copay    PT Start Time 0803    PT Stop Time 0845    PT Time Calculation (min) 42 min    Activity Tolerance Patient tolerated treatment well    Behavior During Therapy [facility] for tasks assessed/performed             Past Medical History:  Diagnosis Date   BPH (benign prostatic hypertrophy) with urinary retention 04/18/2013   Erectile dysfunction 04/18/2013   Family history of pancreatic cancer 04/18/2013   Hyperlipidemia 04/18/2013   Prostatitis     Past Surgical History:  Procedure Laterality Date   COLONOSCOPY     lipoma removal  2014   left chest    There were no vitals filed for this visit.   Subjective Assessment - 05/19/21 0804     Subjective The patient continues with general soreness that he notes is just awareness it is there.  He feels like pain is <1.  He still gets some swimming in 6 days/week and is tolerating further swimming.  He can do 1.5 miles without shoulder pain.    Patient Stated Goals reduce pain    Currently in Pain? Yes    Pain Score --   <1   Pain Location Shoulder    Pain Orientation Right    Pain Descriptors / Indicators Discomfort;Sore    Pain Type Chronic pain    Pain Onset More than a month ago    Pain Frequency Constant    Aggravating Factors  swimming at times, IR stretch    Pain Relieving Factors exercise, meds                [facility] PT Assessment - 05/19/21 1327       Assessment   Medical Diagnosis R  shoulder impingement syndrome    Referring Provider (PT) Aundria Mems, MD    Onset Date/Surgical Date 03/19/21    Hand Dominance Right                           [facility] Adult PT Treatment/Exercise - 05/19/21 1327       Self-Care   Self-Care Other Self-Care Comments    Other Self-Care Comments  discussed swimming and progression of current HEP      Exercises   Exercises Shoulder      Shoulder Exercises: Prone   Retraction Strengthening;Both;10 reps    Retraction Weight (lbs) 3 lbs    Flexion Strengthening;Both;10 reps      Shoulder Exercises: Standing   Horizontal ABduction Strengthening;Both;10 reps;Theraband    Theraband Level (Shoulder Horizontal ABduction) Level 3 (Green)    External Rotation Strengthening;Right;10 reps    Theraband Level (Shoulder External Rotation) Level 3 (Green)    Extension Strengthening;Both;10 reps;AAROM    Theraband Level (Shoulder Extension) Level 3 (Green)    Other Standing Exercises high row, ER, T, Y, I (too easy),      Shoulder Exercises:  ROM/Strengthening   UBE (Upper Arm Bike) UBE L 4 x 2 minutes forward/backward      Shoulder Exercises: Stretch   Other Shoulder Stretches Prone deep anterior shoulder stretch with PT providing rotational pull to shoulder (also stretches thoracic spine)    Other Shoulder Stretches Neural glide UEs                          PT Long Term Goals - 05/19/21 8466       PT LONG TERM GOAL #1   Title The patient will be indep with HEP.    Time 6    Period Weeks    Status Achieved      PT LONG TERM GOAL #2   Title The patient will improve functional status score from 58 up to 73%    Baseline 80%    Time 6    Period Weeks    Status Achieved      PT LONG TERM GOAL #3   Title The patient will improve R IR ROM to reach to L1 without anterior shoulder pain.    Baseline pain at waist band at eval-- now can reach to thoracic spine-- does get mild pain at end range    Time 6     Period Weeks    Status Achieved      PT LONG TERM GOAL #4   Title The patient will perform full AROM without pain along arc of motion.    Time 6    Period Weeks    Status Achieved      PT LONG TERM GOAL #5   Title The patient will tolerate swimming x 10 minute freestyle stroke without pain.    Baseline Can do 1.5 miles (1 hour)    Time 6    Period Weeks    Status Achieved                   Plan - 05/19/21 1336     Clinical Impression Statement The patient has met all LTGs.  He is able to swim 1.5 miles x 6 days/week with occasional discomfort and awareness of tightness.  Discharge today with updated HEP and plan to progress.    PT Treatment/Interventions Taping;Patient/family education;Dry needling;Manual techniques;ADLs/Self Care Home Management;Therapeutic activities;Therapeutic exercise;Electrical Stimulation;Moist Heat;Neuromuscular re-education;Passive range of motion    PT Next Visit Plan discharge    PT Home Exercise Plan Access Code: [email or web address]    Consulted and Agree with Plan of Care Patient             Patient will benefit from skilled therapeutic intervention in order to improve the following deficits and impairments:     Visit Diagnosis: Acute pain of right shoulder  Muscle weakness (generalized)  Other symptoms and signs involving the musculoskeletal system     Problem List Patient Active Problem List   Diagnosis Date Noted   Impingement syndrome, shoulder, right 03/19/2021   Benign positional vertigo 07/04/2019   Elevated blood pressure reading in office without diagnosis of hypertension 10/26/2018   Right peroneal tendinopathy postsurgical repair 59/93/5701   Complicated UTI (urinary tract infection) 09/15/2016   Insomnia 11/26/2015   Hyperglycemia 11/22/2014   Left nephrolithiasis 02/26/2014   History of colonic polyps 06/30/2013   Hyperlipidemia 04/18/2013   Erectile dysfunction 04/18/2013   Benign prostatic hyperplasia with  urinary retention 04/18/2013   Family history of pancreatic cancer 04/18/2013   PHYSICAL THERAPY DISCHARGE SUMMARY  Visits from [facility]  of Care: 7  Current functional level related to goals / functional outcomes: See goals-- all met   Remaining deficits: <1/10 discomfort that is intermittent in nature   Education / Equipment: HEP   Patient agrees to discharge. Patient goals were met. Patient is being discharged due to meeting the stated rehab goals.   [place], Alfred 05/19/2021, 1:39 PM  [facility] [place], [place], [address] Phone: [phone]   Fax:  [phone]  Name: Efrem Pitstick MRN: [id number] Date of Birth: 13-Jan-1961

## 2021-05-19 NOTE — Patient Instructions (Signed)
Access Code: [email or web address] URL: [email or web address] Date: 05/19/2021 Prepared by: Rudell Cobb  Program Notes ICE:  20 minutes 2x/day. Morning x 20 minutes either end of the day or after exercise   Exercises First Rib Mobilization with Strap - 1 x daily - 7 x weekly - 1 sets - 2-3 reps - 30 seconds hold Doorway Pec Stretch at 60 Elevation - 2 x daily - 7 x weekly - 1 sets - 3 reps - 30 seconds hold Doorway Pec Stretch at 120 Degrees Abduction - 2 x daily - 7 x weekly - 1 sets - 3 reps - 30 seconds hold Standing Bicep Stretch at [place] - 2 x daily - 7 x weekly - 1 sets - 3 reps - 30 seconds hold Single Arm Overhead Triceps Extension - 1 x daily - 7 x weekly - 1 sets - 10 reps Scaption with Dumbbells - 1 x daily - 7 x weekly - 2 sets - 15 reps Standing Single Arm Shoulder External Rotation in Abduction with Anchored Resistance - 1 x daily - 7 x weekly - 1 sets - 10 reps Standing Shoulder Horizontal Abduction with Anchored Resistance - 1 x daily - 7 x weekly - 1 sets - 10-12 reps Standing Low Trap Setting with Resistance at Wall - 1 x daily - 7 x weekly - 1 sets - 10 reps Standing Row with Resistance with Anchored Resistance at Chest Height Palms Down - 1 x daily - 7 x weekly - 1 sets - 10 reps Sleeper Stretch - 1 x daily - 7 x weekly - 1 sets - 2 reps - 30 seconds hold Prone Shoulder Horizontal Abduction with Thumbs Up - 1 x daily - 7 x weekly - 1 sets - 10 reps

## 2021-06-02 ENCOUNTER — Ambulatory Visit (INDEPENDENT_AMBULATORY_CARE_PROVIDER_SITE_OTHER): Payer: BC Managed Care – PPO

## 2021-06-02 ENCOUNTER — Ambulatory Visit: Payer: Self-pay | Admitting: Sports Medicine

## 2021-06-02 ENCOUNTER — Other Ambulatory Visit: Payer: Self-pay

## 2021-06-02 DIAGNOSIS — G8929 Other chronic pain: Secondary | ICD-10-CM

## 2021-06-02 DIAGNOSIS — M25571 Pain in right ankle and joints of right foot: Secondary | ICD-10-CM | POA: Diagnosis not present

## 2021-06-02 MED ORDER — OXYCODONE-ACETAMINOPHEN 10-325 MG PO TABS
1.0000 | ORAL_TABLET | Freq: Three times a day (TID) | ORAL | 0 refills | Status: DC | PRN
Start: 2021-06-02 — End: 2021-06-17

## 2021-06-02 NOTE — Assessment & Plan Note (Addendum)
Raymond Craig returns, he is a very pleasant 60 year old male, he has been through a lot, he had an ankle injury back in 2019, March, ultimately was found to have complete tears of the ATFL, CFL and a longitudinal split tear of the peroneus brevis, after casting, boot, physical therapy, and a tibiotalar joint injection were not efficacious he had a couple of operations to repair his peroneals, unfortunately continues to have significant discomfort behind and distal to the lateral malleolus along the peroneus brevis and longus tendons, we did obtain a new MRI that showed significant thickening and tendinopathy/tendinosis of the peroneus longus. Unable to use topical nitroglycerin as he does take nitrates. Lateral wedge, activity modification not efficacious, today we performed PRP percutaneous tenotomy of the peroneus longus and brevis behind and distal to the lateral malleolus. Of note the tendon sheath was very tight, and the tendon was likely scarred down. We will add some narcotics for postoperative pain, boot immobilization and he can return to see me in 2 to 4 weeks. If all of the above fails he will be a candidate for cadaveric graft.

## 2021-06-02 NOTE — Progress Notes (Signed)
    Procedures performed today:    Procedure: Real-time Ultrasound Guided Platelet Rich Plasma (PRP) Injection of right peroneus longus and brevis Device: Samsung HS60  Verbal informed consent obtained.  Time-out conducted.  Noted no overlying erythema, induration, or other signs of local infection.  Obtained 30 cc of blood from peripheral vein, using the "PEAK" centrifuge, red blood cells were separated from the plasma. Subsequently red blood cells were drained leaving only plasma with the buffy coat layer between the desired lines. Platelet poor plasma was then centrifuged out, and remaining platelet rich plasma aspirated into a 5 cc syringe.  Skin prepped in a sterile fashion.  Local anesthesia: Topical Ethyl chloride.  With sterile technique and under real time ultrasound guidance the platelet rich plasma (PRP) obtained above: I injected 2 cc lidocaine, 2 cc bupivacaine into the peroneal tendon sheath, I then switched syringes and injected platelet rich plasma, 3 mL into and around the peroneus longus and brevis at and distal to the lateral malleolus. Completed without difficulty  Advised to call if fevers/chills, erythema, induration, drainage, or persistent bleeding.  Images permanently stored and available for review in PACS.  Impression: Technically successful ultrasound guided Platelet Rich Plasma (PRP) injection.  Independent interpretation of notes and tests performed by another provider:   None.  Brief History, Exam, Impression, and Recommendations:    Right peroneal tendinopathy postsurgical repair Raymond Craig returns, he is a very pleasant 60 year old male, he has been through a lot, he had an ankle injury back in 2019, March, ultimately was found to have complete tears of the ATFL, CFL and a longitudinal split tear of the peroneus brevis, after casting, boot, physical therapy, and a tibiotalar joint injection were not efficacious he had a couple of operations to repair his peroneals,  unfortunately continues to have significant discomfort behind and distal to the lateral malleolus along the peroneus brevis and longus tendons, we did obtain a new MRI that showed significant thickening and tendinopathy/tendinosis of the peroneus longus. Unable to use topical nitroglycerin as he does take nitrates. Lateral wedge, activity modification not efficacious, today we performed PRP percutaneous tenotomy of the peroneus longus and brevis behind and distal to the lateral malleolus. Of note the tendon sheath was very tight, and the tendon was likely scarred down. We will add some narcotics for postoperative pain, boot immobilization and he can return to see me in 2 to 4 weeks. If all of the above fails he will be a candidate for cadaveric graft.    ___________________________________________ [name] [name], M.D., ABFM., CAQSM. Primary Care and [place] Instructor of [place] [facility]

## 2021-06-17 ENCOUNTER — Ambulatory Visit (INDEPENDENT_AMBULATORY_CARE_PROVIDER_SITE_OTHER): Payer: BC Managed Care – PPO | Admitting: Sports Medicine

## 2021-06-17 ENCOUNTER — Other Ambulatory Visit: Payer: Self-pay

## 2021-06-17 DIAGNOSIS — M25571 Pain in right ankle and joints of right foot: Secondary | ICD-10-CM

## 2021-06-17 DIAGNOSIS — M67431 Ganglion, right wrist: Secondary | ICD-10-CM

## 2021-06-17 DIAGNOSIS — G8929 Other chronic pain: Secondary | ICD-10-CM | POA: Diagnosis not present

## 2021-06-17 NOTE — Progress Notes (Signed)
    Procedures performed today:    None.  Independent interpretation of notes and tests performed by another provider:   None.  Brief History, Exam, Impression, and Recommendations:    Right peroneal tendinopathy postsurgical repair Raymond Craig is now post PRP injection of his peroneus longus and brevis tendons, doing at least 60% better compared to prior, he is eager to start physical therapy. Will do this for 6 weeks and potentially consider an additional PRP injection if not sufficiently better afterwards.  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 We will defer treatment for now.    ___________________________________________ [name] [name], M.D., ABFM., CAQSM. Primary Care and [place] Instructor of [place] of [facility]

## 2021-06-17 NOTE — Assessment & Plan Note (Signed)
Asymptomatic, explained recurrence rates and anatomy. We will defer treatment for now.

## 2021-06-17 NOTE — Assessment & Plan Note (Signed)
Raymond Craig is now post PRP injection of his peroneus longus and brevis tendons, doing at least 60% better compared to prior, he is eager to start physical therapy. Will do this for 6 weeks and potentially consider an additional PRP injection if not sufficiently better afterwards.

## 2021-07-01 ENCOUNTER — Other Ambulatory Visit: Payer: Self-pay

## 2021-07-01 ENCOUNTER — Encounter: Payer: Self-pay | Admitting: Rehabilitative and Restorative Service Providers"

## 2021-07-01 ENCOUNTER — Ambulatory Visit (INDEPENDENT_AMBULATORY_CARE_PROVIDER_SITE_OTHER): Payer: BC Managed Care – PPO | Admitting: Rehabilitative and Restorative Service Providers"

## 2021-07-01 DIAGNOSIS — M25571 Pain in right ankle and joints of right foot: Secondary | ICD-10-CM

## 2021-07-01 DIAGNOSIS — G8929 Other chronic pain: Secondary | ICD-10-CM

## 2021-07-01 DIAGNOSIS — R29898 Other symptoms and signs involving the musculoskeletal system: Secondary | ICD-10-CM

## 2021-07-01 DIAGNOSIS — M6281 Muscle weakness (generalized): Secondary | ICD-10-CM

## 2021-07-01 NOTE — Therapy (Signed)
[place], [place], [address] Phone: [phone]   Fax:  [phone]  Physical Therapy Evaluation  Patient Details  Name: Raymond Craig MRN: [id number] Date of Birth: 04-Jan-1961 Referring Provider (PT): Dr Aundria Mems, MD   Encounter Date: 07/01/2021   PT End of Session - 07/01/21 1007     Visit Number 1    Number of Visits 12    Date for PT Re-Evaluation 08/12/21    Authorization Type [employer] may choose 1x/week due to $52 copay    PT Start Time 0845    PT Stop Time 0934    PT Time Calculation (min) 49 min    Activity Tolerance Patient tolerated treatment well    Behavior During Therapy [facility] for tasks assessed/performed             Past Medical History:  Diagnosis Date   BPH (benign prostatic hypertrophy) with urinary retention 04/18/2013   Erectile dysfunction 04/18/2013   Family history of pancreatic cancer 04/18/2013   Hyperlipidemia 04/18/2013   Prostatitis     Past Surgical History:  Procedure Laterality Date   COLONOSCOPY     lipoma removal  2014   left chest    There were no vitals filed for this visit.    Subjective Assessment - 07/01/21 0846     Subjective Patietnt reports that he has had Rt ankle problems for several years about 5 yrs probably due to ankle sprains. He had tendon repair ~ 3 years ago which was not successful. He has another surgery ~ 2 yrs ago to attach the tendon to the another tendon. Ankle remained stiff and can hurt at times. He received PRP injection ~ 6 weeks ago with some improvement. Now has continued stiffness and pain with use. US shows a lot of scar tissue in the tendons.    Pertinent History Rt shoulder pain; arthritis    Patient Stated Goals increase mobility and decrease pain in the Rt ankle to increase activity level and be less grumpy - would like proonged use to walk or hike    Currently in Pain? Yes    Pain Score 4     Pain Location Ankle     Pain Orientation Right    Pain Descriptors / Indicators Dull    Pain Type Chronic pain    Pain Radiating Towards pain in the posterior calf    Pain Onset More than a month ago    Pain Frequency Intermittent    Aggravating Factors  walking or standing; squatting; bending; lifting; stairs    Pain Relieving Factors rest; massage                [facility] PT Assessment - 07/01/21 0001       Assessment   Medical Diagnosis Rt ankle pain    Referring Provider (PT) Dr Aundria Mems, MD    Onset Date/Surgical Date 06/24/20   pain in the past 5 years   Hand Dominance Right    Next MD Visit 07/29/21    Prior Therapy here for ankle and Rt shoulder      Precautions   Precautions None      Restrictions   Weight Bearing Restrictions No      Balance Screen   Has the patient fallen in the past 6 months No    Has the patient had a decrease in activity level because of a fear of falling?  No    Is the patient  reluctant to leave their home because of a fear of falling?  No      Home Ec[place] residence      Prior Function   Level of Independence Independent    Vocation Full time employment    Archivist professor    Leisure yard work; yoga; swimming daily ~ 40-45 min      Observation/Other Assessments   Observations patient wearing elastic ankle support Rt ankle which he states he wears all the time he is up moving/walking    Focus on Therapeutic Outcomes (FOTO)  50      Observation/Other Assessments-Edema    Edema Figure 8      Figure 8 Edema   Figure 8 - Right  53.8 cm    Figure 8 - Left  53 cm      Sensation   Additional Comments WFL's per pt report      Posture/Postural Control   Posture Comments lower Rt hemipelvis compared to Lt      AROM   Right Ankle Dorsiflexion 2    Right Ankle Plantar Flexion 61    Right Ankle Inversion 14    Right Ankle Eversion 12    Left Ankle Dorsiflexion 0    Left Ankle Plantar  Flexion 63    Left Ankle Inversion 23    Left Ankle Eversion 15      Strength   Right Hip Flexion 4+/5    Right Hip Extension 4/5    Right Hip ABduction 4+/5    Left Hip Flexion 5/5    Left Hip Extension 5/5    Left Hip ABduction 5/5    Right/Left Knee --   5/5 bilat   Right Ankle Plantar Flexion 5/5   feels weaker than Lt   Left Ankle Plantar Flexion 5/5      Palpation   Palpation comment tender Rt achiles tendon area to posterior malleolus - peroneals into the dorsum of Rt foot      Special Tests   Other special tests leg length ASIS to medial malleolus bilat 93 cm; greater trochanter to medial mallelous Rt 91; Lt 91.8 cm      Balance   Balance Assessed --   SLS each side 10 sec less steady Rt                       Objective measurements completed on examination: See above findings.       [place] Adult PT Treatment/Exercise - 07/01/21 0001       Knee/Hip Exercises: Stretches   Passive Hamstring Stretch Right;2 reps;30 seconds    Passive Hamstring Stretch Limitations supine with strap    ITB Stretch Right;1 rep;30 seconds    ITB Stretch Limitations supine wtih strap    Gastroc Stretch Right;Left;1 rep;20 seconds    Soleus Stretch Right;Left;1 rep;20 seconds    Other Knee/Hip Stretches hip adductor stretch supine with strap 30 sec x 1 Rt    Other Knee/Hip Stretches figure 4 stretch supine 1 rep x 30 sec Rt      Knee/Hip Exercises: Standing   SLS 20 sec x 3 reps      Knee/Hip Exercises: Supine   Bridges Strengthening;10 reps    Bridges Limitations 5-10 sec hold Lt foot fwd slightly to increase work on [employer] -  07/01/21 0936     Education Details HEP POC    Person(s) Educated Patient    Methods Explanation;Demonstration;Tactile cues;Verbal cues;Handout    Comprehension Verbalized understanding;Returned demonstration;Verbal cues required;Tactile cues required                 PT Long Term Goals -  07/01/21 1003       PT LONG TERM GOAL #1   Title Increased flexibility and ROM Rt hip and ankle to equal or greater than AROM Lt hip and ankle    Time 6    Period Weeks    Status New    Target Date 08/12/21      PT LONG TERM GOAL #2   Title Increase strength Rt hip and ankle to 5/5    Baseline -    Time 6    Period Weeks    Status New    Target Date 08/12/21      PT LONG TERM GOAL #3   Title Patient reports increased standing and walking tolerance to 20-30 min    Baseline -    Time 6    Period Weeks    Status New    Target Date 08/12/21      PT LONG TERM GOAL #4   Title Incependent in HEP including aquatic exercise program    Time 6    Period Weeks    Status New    Target Date 08/12/21      PT LONG TERM GOAL #5   Title improve functional limitation score to 69    Baseline -    Time 6    Period Weeks    Status New    Target Date 08/12/21                    Plan - 07/01/21 2979     Clinical Impression Statement Patient presents with c/o increasing Rt ankle pain over the past year. He received PRP injection of peroneal tendons ~ 6 weeks ago with some improvement but continues to have pain and swelling in the ankle with increased wt bearing activities. Patient had ankle surgeries - 1 ~ 3 yrs ago and 1 ~ 2 yrs ago. he has scar tissue in the peroneal tendon per [place] in Dr Landry Corporal office. Patient has decreased AROM Rt > Lt ankle and Rt hip; decreased PF strength and decreased functional strength Rt ankle and hip; tenderness and tightness to palpation in the Rt peroneal tendon area into the achiles and dorsum of the Rt foot. He appears to have a slight leg length difference with Rt LE ~ 8 cm shorted than Lt with measurements in supine and to visual inspection in standing. Patient will benefit from PT to address problems identified.    Examination-Activity Limitations Bend;Locomotion Level;Squat;Stairs    Examination-Participation Restrictions Yard Work     Stability/Clinical Decision Making Stable/Uncomplicated    Clinical Decision Making Low    Rehab Potential Good    PT Frequency 2x / week    PT Duration 6 weeks    PT Treatment/Interventions ADLs/Self Care Home Management;Aquatic Therapy;Cryotherapy;Electrical Stimulation;Iontophoresis 4mg /ml Dexamethasone;Moist Heat;Ultrasound;Gait training;Stair training;Functional mobility training;Therapeutic activities;Therapeutic exercise;Balance training;Neuromuscular re-education;Patient/family education;Manual techniques;Passive range of motion;Dry needling;Taping;Vasopneumatic Device    PT Next Visit Plan review HEP; progress with strengthening Rt LE; trial of IASTM Rt peroneal tendon/achiles areas; mobs Rt foot; possible trial of heel lift Rt shoe; aquatic exercise stretching and strengthening Rt LE hip and ankle; modalities as indicated    PT  Home Exercise Plan [email or web address]    Consulted and Agree with Plan of Care Patient             Patient will benefit from skilled therapeutic intervention in order to improve the following deficits and impairments:  Decreased range of motion, Decreased activity tolerance, Pain, Decreased balance, Hypomobility, Impaired flexibility, Decreased mobility, Decreased strength, Increased edema  Visit Diagnosis: Chronic pain of right ankle - Plan: PT plan of care cert/re-cert  Muscle weakness (generalized) - Plan: PT plan of care cert/re-cert  Other symptoms and signs involving the musculoskeletal system - Plan: PT plan of care cert/re-cert     Problem List Patient Active Problem List   Diagnosis Date Noted   Ganglion cyst of dorsum of right wrist 06/17/2021   Impingement syndrome, shoulder, right 03/19/2021   Benign positional vertigo 07/04/2019   Elevated blood pressure reading in office without diagnosis of hypertension 10/26/2018   Right peroneal tendinopathy postsurgical repair 48/18/5631   Complicated UTI (urinary tract infection) 09/15/2016   Insomnia  11/26/2015   Hyperglycemia 11/22/2014   Left nephrolithiasis 02/26/2014   History of colonic polyps 06/30/2013   Hyperlipidemia 04/18/2013   Erectile dysfunction 04/18/2013   Benign prostatic hyperplasia with urinary retention 04/18/2013   Family history of pancreatic cancer 04/18/2013    Amalya Salmons Nilda Simmer, PT, MPH  07/01/2021, 10:09 AM  [facility] [place], [place], [address] Phone: [phone]   Fax:  [phone]  Name: Raymond Craig MRN: [id number] Date of Birth: [birth year]

## 2021-07-01 NOTE — Patient Instructions (Signed)
Access Code: [email or web address] URL: [email or web address] Date: 07/01/2021 Prepared by: Gillermo Murdoch  Exercises Beginner Bridge - 1 x daily - 7 x weekly - 1 sets - 10 reps - 5-10 sec hold Hooklying Hamstring Stretch with Strap - 2 x daily - 7 x weekly - 1 sets - 3 reps - 30 sec hold Supine ITB Stretch with Strap - 2 x daily - 7 x weekly - 1 sets - 3 reps - 30 sec hold Hip Adductors and Hamstring Stretch with Strap - 2 x daily - 7 x weekly - 1 sets - 3 reps - 30 sec hold Single Leg Stance - 2 x daily - 7 x weekly - 2 sets - 5 reps - 20 sec hold Standing Gastroc Stretch - 2 x daily - 7 x weekly - 1 sets - 3 reps - 30 sec hold Standing Soleus Stretch - 2 x daily - 7 x weekly - 1 sets - 3 reps - 30 sec hold

## 2021-07-10 ENCOUNTER — Encounter: Payer: BC Managed Care – PPO | Admitting: Rehabilitative and Restorative Service Providers"

## 2021-07-11 ENCOUNTER — Encounter: Payer: Self-pay | Admitting: Physical Therapy

## 2021-07-11 ENCOUNTER — Other Ambulatory Visit: Payer: Self-pay

## 2021-07-11 ENCOUNTER — Ambulatory Visit (INDEPENDENT_AMBULATORY_CARE_PROVIDER_SITE_OTHER): Payer: BC Managed Care – PPO | Admitting: Physical Therapy

## 2021-07-11 DIAGNOSIS — M25571 Pain in right ankle and joints of right foot: Secondary | ICD-10-CM | POA: Diagnosis not present

## 2021-07-11 DIAGNOSIS — R29898 Other symptoms and signs involving the musculoskeletal system: Secondary | ICD-10-CM

## 2021-07-11 DIAGNOSIS — M6281 Muscle weakness (generalized): Secondary | ICD-10-CM | POA: Diagnosis not present

## 2021-07-11 DIAGNOSIS — G8929 Other chronic pain: Secondary | ICD-10-CM

## 2021-07-11 NOTE — Patient Instructions (Signed)
Access Code: [email or web address] URL: [email or web address] Date: 07/11/2021 Prepared by: [facility]  Exercises Beginner Bridge - 1 x daily - 7 x weekly - 1 sets - 10 reps - 5-10 sec hold Hooklying Hamstring Stretch with Strap - 2 x daily - 7 x weekly - 1 sets - 3 reps - 30 sec hold Supine ITB Stretch with Strap - 2 x daily - 7 x weekly - 1 sets - 3 reps - 30 sec hold Hip Adductors and Hamstring Stretch with Strap - 2 x daily - 7 x weekly - 1 sets - 3 reps - 30 sec hold Single Leg Stance - 2 x daily - 7 x weekly - 2 sets - 5 reps - 20 sec hold Standing Gastroc Stretch - 2 x daily - 7 x weekly - 1 sets - 3 reps - 30 sec hold Standing Soleus Stretch - 2 x daily - 7 x weekly - 1 sets - 3 reps - 30 sec hold Forward and Backward Stepping at [employer] - 1 x daily - 7 x weekly - 10 reps Warrior III in [employer] with [employer] - 1 x daily - 7 x weekly - 10 reps Side Stepping - 1 x daily - 7 x weekly - 10 reps Side to Side Hamstring Stretch with [place] at [employer] - 1 x daily - 7 x weekly - 5 reps

## 2021-07-11 NOTE — Therapy (Signed)
[place], [place], [address] Phone: [phone]   Fax:  [phone]  Physical Therapy Treatment  Patient Details  Name: Raymond Craig MRN: [id number] Date of Birth: 06-08-1961 Referring Provider (PT): Dr Aundria Mems, MD   Encounter Date: 07/11/2021   PT End of Session - 07/11/21 1232     Visit Number 2    Number of Visits 12    Date for PT Re-Evaluation 08/12/21    Authorization Type BCBS may choose 1x/week due to $52 copay    PT Start Time 1230    PT Stop Time 1315    PT Time Calculation (min) 45 min    Activity Tolerance Patient tolerated treatment well    Behavior During Therapy [facility] for tasks assessed/performed             Past Medical History:  Diagnosis Date   BPH (benign prostatic hypertrophy) with urinary retention 04/18/2013   Erectile dysfunction 04/18/2013   Family history of pancreatic cancer 04/18/2013   Hyperlipidemia 04/18/2013   Prostatitis     Past Surgical History:  Procedure Laterality Date   COLONOSCOPY     lipoma removal  2014   left chest    There were no vitals filed for this visit.   Subjective Assessment - 07/11/21 1233     Subjective Pt reports his son has been in the hospital so he has not done a lot of exercises.   He's doing hamstring and calf stretch, and balance when at home. He's beginning to wean off using the buoy between legs for swimming.    Patient Stated Goals increase mobility and decrease pain in the Rt ankle to increase activity level and be less grumpy - would like proonged use to walk or hike    Currently in Pain? Yes    Pain Score 3     Pain Location Ankle    Pain Orientation Right;Lateral    Pain Descriptors / Indicators Dull    Aggravating Factors  stairs and hills    Pain Relieving Factors rest.                [facility] PT Assessment - 07/11/21 0001       Assessment   Medical Diagnosis Rt ankle pain    Referring  Provider (PT) Dr Aundria Mems, MD    Onset Date/Surgical Date 06/24/20   pain in the past 5 years   Hand Dominance Right    Next MD Visit 07/29/21    Prior Therapy here for ankle and Rt shoulder      AROM   Right Ankle Dorsiflexion 0               [facility] Adult PT Treatment/Exercise - 07/11/21 0001       Self-Care   Other Self-Care Comments  pt instructed in self - IASTM to Rt Achilles/ fibularis area.  Demo provided.  Pt returned demo with cues (seated at edge of pool using water as lubricant).           Pt seen for aquatic therapy today.  Treatment took place in water 3.25-4 ft in depth at the [facility] pool. Temp of water was 94.  Pt entered/exited the pool via independently with bilat rail.  Treatment:   Without UE support:  forward/ backward gait with focus on heel strike to toe off quality.   Side stepping with/without bent knees;  Squats x 10.   Holding onto side of  pool:   Heel /toe raises x 10.  Gastroc stretch x 20 sec each leg;  soleus stretch x 20 sec x 2 reps each leg.  SLS with knee flexion/ext with hip flexed 10 each leg (kicking foot back and forth).   Quad stretch with noodle support at ankle, to forward lean x 8 reps   Back against the wall:  Hamstring, adductor and ITB stretch with noodle assist; each leg.     Pt requires buoyancy for support and to offload joints with strengthening exercises. Viscosity of the water is needed for resistance of strengthening; water current perturbations provides challenge to standing balance unsupported, requiring increased core activation.    PT Long Term Goals - 07/01/21 1003       PT LONG TERM GOAL #1   Title Increased flexibility and ROM Rt hip and ankle to equal or greater than AROM Lt hip and ankle    Time 6    Period Weeks    Status New    Target Date 08/12/21      PT LONG TERM GOAL #2   Title Increase strength Rt hip and ankle to 5/5    Baseline -    Time 6    Period Weeks    Status New     Target Date 08/12/21      PT LONG TERM GOAL #3   Title Patient reports increased standing and walking tolerance to 20-30 min    Baseline -    Time 6    Period Weeks    Status New    Target Date 08/12/21      PT LONG TERM GOAL #4   Title Incependent in HEP including aquatic exercise program    Time 6    Period Weeks    Status New    Target Date 08/12/21      PT LONG TERM GOAL #5   Title improve functional limitation score to 69    Baseline -    Time 6    Period Weeks    Status New    Target Date 08/12/21                   Plan - 07/11/21 1412     Clinical Impression Statement Pt reported slight increase in Rt lateral ankle discomfort with exercises in the pool, but tolerable.  Pt encouraged to continue stretches on land when waiting around at hospital (standing/ seated) to continue working in RLE flexibility.  HEP updated.  Goals are ongoing.    Examination-Activity Limitations Bend;Locomotion Level;Squat;Stairs    Examination-Participation Restrictions Yard Work    Stability/Clinical Decision Making Stable/Uncomplicated    Rehab Potential Good    PT Frequency 2x / week    PT Duration 6 weeks    PT Treatment/Interventions ADLs/Self Care Home Management;Aquatic Therapy;Cryotherapy;Electrical Stimulation;Iontophoresis 4mg /ml Dexamethasone;Moist Heat;Ultrasound;Gait training;Stair training;Functional mobility training;Therapeutic activities;Therapeutic exercise;Balance training;Neuromuscular re-education;Patient/family education;Manual techniques;Passive range of motion;Dry needling;Taping;Vasopneumatic Device    PT Next Visit Plan progress with strengthening Rt LE; assess response to self-trial of IASTM Rt peroneal tendon/achiles areas; possible trial of heel lift Rt shoe; aquatic exercise stretching and strengthening Rt LE hip and ankle; modalities as indicated    PT Home Exercise Plan [id number]    Consulted and Agree with Plan of Care Patient              Patient will benefit from skilled therapeutic intervention in order to improve the following deficits and impairments:  Decreased range of motion, Decreased  activity tolerance, Pain, Decreased balance, Hypomobility, Impaired flexibility, Decreased mobility, Decreased strength, Increased edema  Visit Diagnosis: Chronic pain of right ankle  Muscle weakness (generalized)  Other symptoms and signs involving the musculoskeletal system     Problem List Patient Active Problem List   Diagnosis Date Noted   Ganglion cyst of dorsum of right wrist 06/17/2021   Impingement syndrome, shoulder, right 03/19/2021   Benign positional vertigo 07/04/2019   Elevated blood pressure reading in office without diagnosis of hypertension 10/26/2018   Right peroneal tendinopathy postsurgical repair 46/96/2952   Complicated UTI (urinary tract infection) 09/15/2016   Insomnia 11/26/2015   Hyperglycemia 11/22/2014   Left nephrolithiasis 02/26/2014   History of colonic polyps 06/30/2013   Hyperlipidemia 04/18/2013   Erectile dysfunction 04/18/2013   Benign prostatic hyperplasia with urinary retention 04/18/2013   Family history of pancreatic cancer 04/18/2013   Kerin Perna, PTA 07/11/21 2:18 PM  [place], [place], [address] Phone: [phone]   Fax:  [phone]  Name: Raymond Craig MRN: [id number] Date of Birth: August 26, 1960

## 2021-07-15 ENCOUNTER — Encounter: Payer: BC Managed Care – PPO | Admitting: Rehabilitative and Restorative Service Providers"

## 2021-07-16 ENCOUNTER — Other Ambulatory Visit: Payer: Self-pay | Admitting: Osteopathic Medicine

## 2021-07-16 DIAGNOSIS — N529 Male erectile dysfunction, unspecified: Secondary | ICD-10-CM

## 2021-07-25 ENCOUNTER — Ambulatory Visit: Payer: BC Managed Care – PPO | Admitting: Physical Therapy

## 2021-07-29 ENCOUNTER — Ambulatory Visit: Payer: BC Managed Care – PPO | Admitting: Sports Medicine

## 2021-08-01 ENCOUNTER — Ambulatory Visit: Payer: BC Managed Care – PPO | Admitting: Physical Therapy

## 2021-08-08 ENCOUNTER — Ambulatory Visit: Payer: BC Managed Care – PPO | Admitting: Physical Therapy

## 2021-08-09 ENCOUNTER — Other Ambulatory Visit: Payer: Self-pay | Admitting: Osteopathic Medicine

## 2021-08-09 DIAGNOSIS — G4700 Insomnia, unspecified: Secondary | ICD-10-CM

## 2021-08-11 NOTE — Telephone Encounter (Signed)
Routing to covering provider.  °

## 2021-08-12 ENCOUNTER — Other Ambulatory Visit: Payer: Self-pay

## 2021-08-12 ENCOUNTER — Ambulatory Visit: Payer: BC Managed Care – PPO | Admitting: Sports Medicine

## 2021-08-12 DIAGNOSIS — M25571 Pain in right ankle and joints of right foot: Secondary | ICD-10-CM | POA: Diagnosis not present

## 2021-08-12 DIAGNOSIS — G8929 Other chronic pain: Secondary | ICD-10-CM | POA: Diagnosis not present

## 2021-08-12 NOTE — Progress Notes (Signed)
° ° °  Procedures performed today:    None.  Independent interpretation of notes and tests performed by another provider:   None.  Brief History, Exam, Impression, and Recommendations:    Right peroneal tendinopathy post surgical repair Jaquane returns, he is a pleasant 60 year old male, he has peroneal tendinopathy, he is well post surgical repair of a peroneal tear, we did a PRP injection into the peroneus longus and brevis tendon sheath, at the last visit he was about 60% better, he has started physical therapy including aquatic therapy and this also continues to improve his symptomatology. He is happy with how things are going right now though he seems to be plateaued at almost half better compared to prior to the PRP injection, we can revisit an additional PRP injection sometime in February per his request. Return as needed.    ___________________________________________ [name] [name], M.D., ABFM., CAQSM. Primary Care and [place] Instructor of [employer] [facility]

## 2021-08-12 NOTE — Assessment & Plan Note (Signed)
Raymond Craig returns, he is a pleasant 60 year old male, he has peroneal tendinopathy, he is well post surgical repair of a peroneal tear, we did a PRP injection into the peroneus longus and brevis tendon sheath, at the last visit he was about 60% better, he has started physical therapy including aquatic therapy and this also continues to improve his symptomatology. He is happy with how things are going right now though he seems to be plateaued at almost half better compared to prior to the PRP injection, we can revisit an additional PRP injection sometime in February per his request. Return as needed.

## 2021-08-15 ENCOUNTER — Ambulatory Visit (INDEPENDENT_AMBULATORY_CARE_PROVIDER_SITE_OTHER): Payer: BC Managed Care – PPO | Admitting: Physical Therapy

## 2021-08-15 ENCOUNTER — Other Ambulatory Visit: Payer: Self-pay

## 2021-08-15 DIAGNOSIS — R29898 Other symptoms and signs involving the musculoskeletal system: Secondary | ICD-10-CM

## 2021-08-15 DIAGNOSIS — M25571 Pain in right ankle and joints of right foot: Secondary | ICD-10-CM | POA: Diagnosis not present

## 2021-08-15 DIAGNOSIS — M6281 Muscle weakness (generalized): Secondary | ICD-10-CM | POA: Diagnosis not present

## 2021-08-15 DIAGNOSIS — G8929 Other chronic pain: Secondary | ICD-10-CM

## 2021-08-15 NOTE — Addendum Note (Signed)
Addended by: Everardo All on: 08/15/2021 07:38 PM   Modules accepted: Orders

## 2021-08-15 NOTE — Therapy (Addendum)
[place], [place], [address] Phone: [phone]   Fax:  [phone]  Physical Therapy Treatment and Re-certification   Patient Details  Name: Raymond Craig MRN: [id number] Date of Birth: Jan 23, 1961 Referring Provider (PT): Dr Aundria Mems, MD   Encounter Date: 08/15/2021   PT End of Session - 08/15/21 1240     Visit Number 3    Number of Visits 12    Date for PT Re-Evaluation 10/10/21    Authorization Type [employer] may choose 1x/week due to $52 copay    PT Start Time 1240    PT Stop Time 1328    PT Time Calculation (min) 48 min    Activity Tolerance Patient tolerated treatment well    Behavior During Therapy [facility] for tasks assessed/performed             Past Medical History:  Diagnosis Date   BPH (benign prostatic hypertrophy) with urinary retention 04/18/2013   Erectile dysfunction 04/18/2013   Family history of pancreatic cancer 04/18/2013   Hyperlipidemia 04/18/2013   Prostatitis     Past Surgical History:  Procedure Laterality Date   COLONOSCOPY     lipoma removal  2014   left chest    There were no vitals filed for this visit.   Subjective Assessment - 08/15/21 1234     Subjective Pt reports stiffness in Rt ankle from stopping/starting during driving through [place] lights.  He is still swimming every other day; using foot more than previously. Stiffness is greatest complaint.  He is completing IASTM to ankle about every day. He is not wearing his ankle brace as much.    Patient Stated Goals increase mobility and decrease pain in the Rt ankle to increase activity level and be less grumpy - would like proonged use to walk or hike    Currently in Pain? Yes    Pain Score 3     Pain Location Ankle    Pain Orientation Right;Lateral    Pain Descriptors / Indicators Dull    Aggravating Factors  hills    Pain Relieving Factors rest                [facility] PT Assessment -  08/15/21 0001       Assessment   Medical Diagnosis Rt ankle pain    Referring Provider (PT) Dr Aundria Mems, MD    Onset Date/Surgical Date 06/24/20   pain in the past 5 years   Hand Dominance Right    Next MD Visit 07/29/21    Prior Therapy here for ankle and Rt shoulder      Observation/Other Assessments   Focus on Therapeutic Outcomes (FOTO)  67 functional score      ROM / Strength   AROM / PROM / Strength PROM      AROM   Right Ankle Dorsiflexion 5    Right Ankle Inversion 20    Left Ankle Dorsiflexion 5    Left Ankle Inversion 20      PROM   PROM Assessment Site Ankle    Right/Left Ankle Right    Right Ankle Dorsiflexion 20   standing, in stretch position     Strength   Right Hip Flexion 4+/5    Right Hip Extension 4+/5    Right Hip ABduction 4+/5    Left Hip Flexion 4+/5    Left Hip Extension 4+/5    Left Hip ABduction 4/5    Right Ankle Inversion 4/5  Right Ankle Eversion 4-/5    Left Ankle Dorsiflexion 5/5            Pt seen for aquatic therapy today.  Treatment took place in water 3.25-4.8 ft in depth at the [employer] pool. Temp of water was 94.  Pt entered/exited the pool via independently with bilat rail.   Treatment:    Without UE support:  forward/ backward gait with focus on heel strike to toe off quality.   Side stepping without bent knees;  Walking lunges, forward/ backward.  Side shuffle R/L (over 4 ft depth), forward skip.  Jogging in place (over 61f depth), then light jog forward/ backward.   Holding onto pool noodle:  Knee to noodle (hip abdct, knee flexed) to curtsy lunge x 12 each.   Holding onto side of pool:    Gastroc stretch x 20 sec each leg;   Lt SLS with Rt hip abdct x 10 (1.5# on ankle), Rt hip ext x 10, Rt hip flexion x 10 (with knee flex to ext, simulating flutter kick)  Quad stretch with noodle support at ankle, to forward lean x 8 reps. Light jumping jacks x 5.   Back against the wall:  Hamstring, adductor  and ITB stretch with noodle assist; each leg.     Seated outside of pool:  Rt ankle eversion with green band x 12 reps.    Pt requires buoyancy for support and to offload joints with strengthening exercises. Viscosity of the water is needed for resistance of strengthening; water current perturbations provides challenge to standing balance unsupported, requiring increased core activation.     PT Education - 08/15/21 1339     Education Details discussed Ktape application for Rt lateral ankle (to increase proprioception/ decrease swelling)    Person(s) Educated Patient    Methods Explanation;Demonstration;Tactile cues;Verbal cues    Comprehension Verbalized understanding                 PT Long Term Goals - 08/15/21 1934       PT LONG TERM GOAL #1   Title Increased flexibility and ROM Rt hip and ankle to equal or greater than AROM Lt hip and ankle    Time 8    Period Weeks    Status On-going    Target Date 10/10/21      PT LONG TERM GOAL #2   Title Increase strength Rt hip and ankle to 5/5    Time 8    Period Weeks    Status On-going    Target Date 10/10/21      PT LONG TERM GOAL #3   Title Patient reports increased standing and walking tolerance to 20-30 min    Time 6    Period Weeks    Status Achieved    Target Date 08/12/21      PT LONG TERM GOAL #4   Title Independent in HEP including aquatic exercise program    Time 8    Period Weeks    Status Partially Met    Target Date 10/10/21      PT LONG TERM GOAL #5   Title improve functional limitation score to 69    Time 8    Period Weeks    Status On-going    Target Date 10/10/21                   Plan - 08/15/21 1337     Clinical Impression Statement Pt's Rt ankle ROM has improved;  near Lt ankle.  Rt hip remains weak compared to LLE. FOTO score has improved. He tolerated exercises well, noting "work" and stiffness, but no increase in Rt ankle pain.  Pt is progressing gradually towards LTGs and  will benefit from continued PT intervention to assist return to previous level of function.    Examination-Activity Limitations Bend;Locomotion Level;Squat;Stairs    Examination-Participation Restrictions Yard Work    Stability/Clinical Decision Making Stable/Uncomplicated    Rehab Potential Good    PT Frequency 2x / week    PT Duration 6 weeks    PT Treatment/Interventions ADLs/Self Care Home Management;Aquatic Therapy;Cryotherapy;Electrical Stimulation;Iontophoresis 77m/ml Dexamethasone;Moist Heat;Ultrasound;Gait training;Stair training;Functional mobility training;Therapeutic activities;Therapeutic exercise;Balance training;Neuromuscular re-education;Patient/family education;Manual techniques;Passive range of motion;Dry needling;Taping;Vasopneumatic Device    PT Next Visit Plan progress with strengthening Rt LE; continue aquatic exercise stretching and strengthening Rt LE hip and ankle.    PT Home Exercise Plan [id number]    Consulted and Agree with Plan of Care Patient             Patient will benefit from skilled therapeutic intervention in order to improve the following deficits and impairments:  Decreased range of motion, Decreased activity tolerance, Pain, Decreased balance, Hypomobility, Impaired flexibility, Decreased mobility, Decreased strength, Increased edema  Visit Diagnosis: Chronic pain of right ankle  Muscle weakness (generalized)  Other symptoms and signs involving the musculoskeletal system     Problem List Patient Active Problem List   Diagnosis Date Noted   Ganglion cyst of dorsum of right wrist 06/17/2021   Impingement syndrome, shoulder, right 03/19/2021   Benign positional vertigo 07/04/2019   Elevated blood pressure reading in office without diagnosis of hypertension 10/26/2018   Right peroneal tendinopathy post surgical repair 018/59/0931  Complicated UTI (urinary tract infection) 09/15/2016   Insomnia 11/26/2015   Hyperglycemia 11/22/2014   Left  nephrolithiasis 02/26/2014   History of colonic polyps 06/30/2013   Hyperlipidemia 04/18/2013   Erectile dysfunction 04/18/2013   Benign prostatic hyperplasia with urinary retention 04/18/2013   Family history of pancreatic cancer 04/18/2013   JKerin Perna PTA 08/15/21 7:36 PM  Celyn P. HHelene KelpPT, MPH 08/15/21 7:36 PM   [place] [address]Phone: [phone]  Fax:  [phone] Name: Raymond SailorsMRN: [id number] of Birth: [birth year]

## 2021-08-28 ENCOUNTER — Other Ambulatory Visit: Payer: Self-pay | Admitting: Sports Medicine

## 2021-08-28 DIAGNOSIS — G4700 Insomnia, unspecified: Secondary | ICD-10-CM

## 2021-08-29 NOTE — Telephone Encounter (Signed)
Needs OV to establish with new provider.

## 2021-08-29 NOTE — Telephone Encounter (Signed)
LVM for pt to call to schedule appt.  No further refills until he establishes care with new PCP.  Charyl Bigger, CMA

## 2021-09-05 ENCOUNTER — Other Ambulatory Visit: Payer: Self-pay

## 2021-09-05 ENCOUNTER — Ambulatory Visit: Payer: BC Managed Care – PPO | Attending: Sports Medicine | Admitting: Physical Therapy

## 2021-09-05 ENCOUNTER — Encounter: Payer: Self-pay | Admitting: Family Medicine

## 2021-09-05 ENCOUNTER — Ambulatory Visit (INDEPENDENT_AMBULATORY_CARE_PROVIDER_SITE_OTHER): Payer: BC Managed Care – PPO | Admitting: Family Medicine

## 2021-09-05 VITALS — BP 145/88 | HR 63 | Temp 98.3°F | Ht 72.0 in | Wt 219.1 lb

## 2021-09-05 DIAGNOSIS — M25571 Pain in right ankle and joints of right foot: Secondary | ICD-10-CM | POA: Insufficient documentation

## 2021-09-05 DIAGNOSIS — R29898 Other symptoms and signs involving the musculoskeletal system: Secondary | ICD-10-CM

## 2021-09-05 DIAGNOSIS — G8929 Other chronic pain: Secondary | ICD-10-CM | POA: Diagnosis present

## 2021-09-05 DIAGNOSIS — M6281 Muscle weakness (generalized): Secondary | ICD-10-CM

## 2021-09-05 DIAGNOSIS — E785 Hyperlipidemia, unspecified: Secondary | ICD-10-CM | POA: Diagnosis not present

## 2021-09-05 DIAGNOSIS — G4700 Insomnia, unspecified: Secondary | ICD-10-CM

## 2021-09-05 DIAGNOSIS — N529 Male erectile dysfunction, unspecified: Secondary | ICD-10-CM

## 2021-09-05 MED ORDER — ZOLPIDEM TARTRATE 10 MG PO TABS: 5.0000 mg | ORAL_TABLET | Freq: Every evening | ORAL | 0 refills | Status: DC | PRN

## 2021-09-05 MED ORDER — ATORVASTATIN CALCIUM 20 MG PO TABS: 20.0000 mg | ORAL_TABLET | Freq: Every day | ORAL | 3 refills | Status: AC

## 2021-09-05 MED ORDER — TADALAFIL 2.5 MG PO TABS: 1.0000 | ORAL_TABLET | Freq: Every day | ORAL | 1 refills | Status: AC

## 2021-09-05 NOTE — Therapy (Addendum)
Ray[facility] [place], [place], [address] Phone: [phone]   Fax:  [phone]  Physical Therapy Treatment and Discharge Summary  PHYSICAL THERAPY DISCHARGE SUMMARY  Visits from Start of Care: 4  Current functional level related to goals / functional outcomes: See progress note for discharge status   Remaining deficits: Continued symptoms    Education / Equipment: HEP   Patient agrees to discharge. Patient goals were partially met. Patient is being discharged due to financial reasons.  Raymond Craig PT, MPH 09/22/21 1:09 PM  Patient Details  Name: Raymond Craig MRN: [id number] Date of Birth: 01/19/1961 Referring Provider (PT): Dr Aundria Mems, MD   Encounter Date: 09/05/2021   PT End of Session - 09/05/21 1249     Visit Number 4    Number of Visits 12    Date for PT Re-Evaluation 10/10/21    PT Start Time 1234    PT Stop Time 0165    PT Time Calculation (min) 31 min (had to leave early for prior commitment)            Past Medical History:  Diagnosis Date   BPH (benign prostatic hypertrophy) with urinary retention 04/18/2013   Erectile dysfunction 04/18/2013   Family history of pancreatic cancer 04/18/2013   Hyperlipidemia 04/18/2013   Prostatitis     Past Surgical History:  Procedure Laterality Date   COLONOSCOPY     lipoma removal  2014   left chest    There were no vitals filed for this visit.   Subjective Assessment - 09/05/21 1256     Subjective Pt reports he is feeling a little better; ankle pain has decreased.  He has been doing aquatic exercicses 4x/wk as well as self IASTM.  Stiffness is still the biggest complaint.    Patient Stated Goals increase mobility and decrease pain in the Rt ankle to increase activity level and be less grumpy - would like proonged use to walk or hike    Currently in Pain? Yes    Pain Score 2     Pain Location Ankle    Pain Orientation  Right;Lateral    Pain Descriptors / Indicators Dull    Aggravating Factors  hills (but getting better)    Pain Relieving Factors rest             Pt seen for aquatic therapy today.  Treatment took place in water 3.25-4 ft in depth at the [facility] pool. Temp of water was 932.  Pt entered/exited the pool via stairs independently with single rail.  Treatment:   Without UE support: Walking forward, backward, side stepping.  Braiding R/L.  Curtsy lunges.  Heel walking, toe walking.  Prancing (light jog rolling through feet). Jumping jacks (chest level water).  Single leg hurdle motion x 10 each leg, then walking hurdle motion with attempts to add arms pulling towards center.   With UE support on pool edge: single leg egg beater, both directions. SLS with hip abdct, ext, flexion x 10 each leg.  Rt heel raises x 10.  Calf stretch. Retro step up with RLE and forward step down with LLE.   Rt knee flex / ext with hip flexed to 90  With UE support on floatation device:  squat mini jumps.    Pt requires buoyancy for support and to offload joints with strengthening exercises. Viscosity of the water is needed for resistance of strengthening; water current perturbations provides challenge to standing balance  unsupported, requiring increased core activation.    PT Long Term Goals - 08/15/21 1934       PT LONG TERM GOAL #1   Title Increased flexibility and ROM Rt hip and ankle to equal or greater than AROM Lt hip and ankle    Time 8    Period Weeks    Status On-going    Target Date 10/10/21      PT LONG TERM GOAL #2   Title Increase strength Rt hip and ankle to 5/5    Time 8    Period Weeks    Status On-going    Target Date 10/10/21      PT LONG TERM GOAL #3   Title Patient reports increased standing and walking tolerance to 20-30 min    Time 6    Period Weeks    Status Achieved    Target Date 08/12/21      PT LONG TERM GOAL #4   Title Independent in HEP including  aquatic exercise program    Time 8    Period Weeks    Status Partially Met    Target Date 10/10/21      PT LONG TERM GOAL #5   Title improve functional limitation score to 69    Time 8    Period Weeks    Status On-going    Target Date 10/10/21                   Plan - 09/05/21 1740     Clinical Impression Statement Pt tolerated aquatic exercises well without increase in pain.  Added verbally to his HEP in water.  Progressing well towards remaining goals with reported reduction of pain with activity.    Examination-Activity Limitations Bend;Locomotion Level;Squat;Stairs    Examination-Participation Restrictions Yard Work    Stability/Clinical Decision Making Stable/Uncomplicated    Rehab Potential Good    PT Frequency 2x / week    PT Duration 6 weeks    PT Treatment/Interventions ADLs/Self Care Home Management;Aquatic Therapy;Cryotherapy;Electrical Stimulation;Iontophoresis 43m/ml Dexamethasone;Moist Heat;Ultrasound;Gait training;Stair training;Functional mobility training;Therapeutic activities;Therapeutic exercise;Balance training;Neuromuscular re-education;Patient/family education;Manual techniques;Passive range of motion;Dry needling;Taping;Vasopneumatic Device    PT Next Visit Plan progress with strengthening Rt LE; continue aquatic exercise stretching and strengthening Rt LE hip and ankle. Assess goals.    PT Home Exercise Plan [id number]    Consulted and Agree with Plan of Care Patient             Patient will benefit from skilled therapeutic intervention in order to improve the following deficits and impairments:  Decreased range of motion, Decreased activity tolerance, Pain, Decreased balance, Hypomobility, Impaired flexibility, Decreased mobility, Decreased strength, Increased edema  Visit Diagnosis: Chronic pain of right ankle  Muscle weakness (generalized)  Other symptoms and signs involving the musculoskeletal system     Problem List Patient Active  Problem List   Diagnosis Date Noted   Ganglion cyst of dorsum of right wrist 06/17/2021   Impingement syndrome, shoulder, right 03/19/2021   Benign positional vertigo 07/04/2019   Elevated blood pressure reading in office without diagnosis of hypertension 10/26/2018   Right peroneal tendinopathy post surgical repair 085/27/7824  Complicated UTI (urinary tract infection) 09/15/2016   Insomnia 11/26/2015   Hyperglycemia 11/22/2014   Left nephrolithiasis 02/26/2014   History of colonic polyps 06/30/2013   Hyperlipidemia 04/18/2013   Erectile dysfunction 04/18/2013   Benign prostatic hyperplasia with urinary retention 04/18/2013   Family history of pancreatic cancer 04/18/2013    JKerin Perna  PTA 09/05/21 5:50 PM   [place], [place], [address] Phone: [phone]   Fax:  [phone]  Name: Deveon Kisiel MRN: [id number] Date of Birth: 1960-12-23

## 2021-09-05 NOTE — Progress Notes (Signed)
Established Patient Office Visit  Subjective:  Patient ID: Raymond Craig, male    DOB: 1961-02-28  Age: 61 y.o. MRN: [id number]  CC:  Chief Complaint  Patient presents with   Medication Refill    HPI Raymond Craig presents for medication refill.    Insomnia: Ambien 10 mg nightly - needing whole tablet every night for the past several years. States nothing else has worked. Has tried to start weaning down to 1/2 tablet, but when he wakes up around 1am he will take the other 1/2 to go back to sleep. Denies any anxiety/depression.    HLD: Currently on Lipitor 20 mg. No side effects. Requesting refill.   ED: Tadalafil working well. No complaints or concerns.    Patient denies any chest pain, palpitations, shortness of breath, recurrent headaches, vision changes, edema, fatigue.     Past Medical History:  Diagnosis Date   BPH (benign prostatic hypertrophy) with urinary retention 04/18/2013   Erectile dysfunction 04/18/2013   Family history of pancreatic cancer 04/18/2013   Hyperlipidemia 04/18/2013   Prostatitis     Past Surgical History:  Procedure Laterality Date   COLONOSCOPY     lipoma removal  2014   left chest    Family History  Problem Relation Age of Onset   Breast cancer Mother    Pancreatic cancer Father    Pancreatic cancer Paternal Uncle    Colon cancer Neg Hx    Colon polyps Neg Hx    Esophageal cancer Neg Hx    Rectal cancer Neg Hx    Stomach cancer Neg Hx     Social History   Socioeconomic History   Marital status: Married    Spouse name: Not on file   Number of children: Not on file   Years of education: Not on file   Highest education level: Not on file  Occupational History   Not on file  Tobacco Use   Smoking status: Never   Smokeless tobacco: Never  Substance and Sexual Activity   Alcohol use: Yes    Alcohol/week: 4.0 standard drinks    Types: 4 Cans of beer per week   Drug use: No   Sexual activity: Not on file  Other Topics Concern    Not on file  Social History Narrative   Not on file   Social Determinants of Health   Financial Resource Strain: Not on file  Food Insecurity: Not on file  Transportation Needs: Not on file  Physical Activity: Not on file  Stress: Not on file  Social Connections: Not on file  Intimate Partner Violence: Not on file    Outpatient Medications Prior to Visit  Medication Sig Dispense Refill   meloxicam (MOBIC) 15 MG tablet TAKE 1 TABLET (15 MG TOTAL) BY MOUTH DAILY. 30 tablet 3   atorvastatin (LIPITOR) 20 MG tablet Take 1 tablet (20 mg total) by mouth daily. 90 tablet 3   Tadalafil 2.5 MG TABS TAKE 1 TABLET BY MOUTH EVERY DAY 30 tablet 1   zolpidem (AMBIEN) 10 MG tablet TAKE 0.5-1 TABLETS (5-10 MG TOTAL) BY MOUTH AT BEDTIME AS NEEDED. 14 tablet 0   Facility-Administered Medications Prior to Visit  Medication Dose Route Frequency Provider Last Rate Last Admin   0.9 %  sodium chloride infusion  500 mL Intravenous Continuous Milus Banister, MD        No Known Allergies  ROS Review of Systems All review of systems negative except what is listed in the HPI  Objective:    Physical Exam Vitals reviewed.  Constitutional:      Appearance: Normal appearance.  HENT:     Head: Normocephalic and atraumatic.  Cardiovascular:     Rate and Rhythm: Normal rate and regular rhythm.     Pulses: Normal pulses.     Heart sounds: Normal heart sounds.  Pulmonary:     Effort: Pulmonary effort is normal.     Breath sounds: Normal breath sounds.  Musculoskeletal:     Right lower leg: No edema.     Left lower leg: No edema.  Skin:    General: Skin is warm and dry.  Neurological:     General: No focal deficit present.     Mental Status: He is alert and oriented to person, place, and time. Mental status is at baseline.  Psychiatric:        Mood and Affect: Mood normal.        Behavior: Behavior normal.        Thought Content: Thought content normal.        Judgment: Judgment normal.     BP (!) 145/88 (BP Location: Left Arm, Patient Position: Sitting, Cuff Size: Large)    Pulse 63    Temp 98.3 F (36.8 C) (Oral)    Ht 6' (1.829 m)    Wt 219 lb 1.3 oz (99.4 kg)    SpO2 95%    BMI 29.71 kg/m  Wt Readings from Last 3 Encounters:  09/05/21 219 lb 1.3 oz (99.4 kg)  02/21/21 213 lb 11.2 oz (96.9 kg)  04/05/20 205 lb (93 kg)     Health Maintenance Due  Topic Date Due   HIV Screening  Never done   Hepatitis C Screening  Never done    There are no preventive care reminders to display for this patient.  No results found for: TSH Lab Results  Component Value Date   WBC 4.9 02/21/2021   HGB 15.8 02/21/2021   HCT 48.4 02/21/2021   MCV 90.8 02/21/2021   PLT 162 02/21/2021   Lab Results  Component Value Date   NA 140 02/21/2021   K 4.0 02/21/2021   CO2 28 02/21/2021   GLUCOSE 108 (H) 02/21/2021   BUN 15 02/21/2021   CREATININE 0.91 02/21/2021   BILITOT 0.6 02/21/2021   ALKPHOS 41 11/21/2014   AST 20 02/21/2021   ALT 23 02/21/2021   PROT 6.6 02/21/2021   ALBUMIN 4.3 11/21/2014   CALCIUM 10.1 02/21/2021   Lab Results  Component Value Date   CHOL 176 02/21/2021   Lab Results  Component Value Date   HDL 51 02/21/2021   Lab Results  Component Value Date   LDLCALC 108 (H) 02/21/2021   Lab Results  Component Value Date   TRIG 83 02/21/2021   Lab Results  Component Value Date   CHOLHDL 3.5 02/21/2021   Lab Results  Component Value Date   HGBA1C 5.6 02/21/2021      Assessment & Plan:   1. Insomnia, unspecified type Thorough discussion with patient. Will refill for one month, but recommended he start trying to wean before establishing with new PCP. Patient is agreeable and understands the risks of long term use of this medication.   - zolpidem (AMBIEN) 10 MG tablet; Take 0.5-1 tablets (5-10 mg total) by mouth at bedtime as needed.  Dispense: 30 tablet; Refill: 0  2. Erectile dysfunction, unspecified erectile dysfunction type - Tadalafil 2.5  MG TABS; Take 1 tablet (2.5 mg total)  by mouth daily.  Dispense: 30 tablet; Refill: 1  3. Hyperlipidemia, unspecified hyperlipidemia type Continue healthy diet and exercise. Seeing PCP in a few weeks. Labs can be rechecked at that time.  - atorvastatin (LIPITOR) 20 MG tablet; Take 1 tablet (20 mg total) by mouth daily.  Dispense: 90 tablet; Refill: 3   Follow-up: Return for as scheduled .    Terrilyn Saver, NP

## 2021-09-05 NOTE — Patient Instructions (Signed)
Refills sent in. Try to start weaning off of your [place] before your upcoming appointment with Joy.

## 2021-09-12 ENCOUNTER — Telehealth (INDEPENDENT_AMBULATORY_CARE_PROVIDER_SITE_OTHER): Payer: BC Managed Care – PPO | Admitting: Family Medicine

## 2021-09-12 ENCOUNTER — Ambulatory Visit: Payer: BC Managed Care – PPO | Admitting: Physical Therapy

## 2021-09-12 DIAGNOSIS — U071 COVID-19: Secondary | ICD-10-CM | POA: Diagnosis not present

## 2021-09-12 NOTE — Progress Notes (Signed)
° ° °  Virtual Visit via Video Note  I connected with Raymond Craig on 09/12/21 at  1:00 PM EST by a video enabled telemedicine application and verified that I am speaking with the correct person using two identifiers.   I discussed the limitations of evaluation and management by telemedicine and the availability of in person appointments. The patient expressed understanding and agreed to proceed.  Patient location: at home Provider location: in office  Subjective:    CC:  No chief complaint on file.   HPI: Sxs started Sat night about 7 days ago and tested positive for COVID the next day.  Started Paxlovid and it is finishing up the medication today. HA and fever is better. Still has a lingering cough. Cough is mostly upper chest.  Slept well last night. Mild SOB with exertion. No GI sxs.  Was using some cold medicine initially until he tested positive and then when he started the Paxil bid he quit taking it.   Past medical history, Surgical history, Family history not pertinant except as noted below, Social history, Allergies, and medications have been entered into the medical record, reviewed, and corrections made.    Objective:    General: Speaking clearly in complete sentences without any shortness of breath.  Alert and oriented x3.  Normal judgment. No apparent acute distress.    Impression and Recommendations:    Problem List Items Addressed This Visit   None Visit Diagnoses     COVID-19    -  Primary      COVID-19-he sounds like he is recovering really well still having some residual cough which is not unusual.  Discussed that it can take another week or 2 for this to completely clear.  Okay to start using cough and cold medicine again if he feels like that would be helpful his go to in the past has been Robitussin which is a great option.  He is also welcome to call us back if he feels like if at any point he is not continuing to prove, suddenly gets worse, or feels like he  is having rebound symptoms after coming off the Paxil bid.  Work note provided.  Okay to return on Monday but he will need to mask Monday and Tuesday for his full 10-day quarantine.  No orders of the defined types were placed in this encounter.   No orders of the defined types were placed in this encounter.    I discussed the assessment and treatment plan with the patient. The patient was provided an opportunity to ask questions and all were answered. The patient agreed with the plan and demonstrated an understanding of the instructions.   The patient was advised to call back or seek an in-person evaluation if the symptoms worsen or if the condition fails to improve as anticipated.   Beatrice Lecher, MD

## 2021-09-19 ENCOUNTER — Ambulatory Visit: Payer: BC Managed Care – PPO | Admitting: Physical Therapy

## 2021-09-19 ENCOUNTER — Telehealth (INDEPENDENT_AMBULATORY_CARE_PROVIDER_SITE_OTHER): Payer: BC Managed Care – PPO | Admitting: Sports Medicine

## 2021-09-19 DIAGNOSIS — U071 COVID-19: Secondary | ICD-10-CM

## 2021-09-19 NOTE — Progress Notes (Signed)
Tested positive 2 weeks ago, did paxlovid and isolation. Patient felt better and was mostly symptom free for a week before symptoms returned. He tested again and was COVID positive again.

## 2021-09-19 NOTE — Progress Notes (Signed)
Was in the virtual visit room from 1:27 through 1:40, attempted to call patient twice, no answer, phone just rang.

## 2021-09-23 ENCOUNTER — Other Ambulatory Visit: Payer: Self-pay | Admitting: Osteopathic Medicine

## 2021-09-23 DIAGNOSIS — M7541 Impingement syndrome of right shoulder: Secondary | ICD-10-CM

## 2021-10-24 ENCOUNTER — Ambulatory Visit: Payer: BC Managed Care – PPO | Admitting: Medical-Surgical

## 2021-12-16 ENCOUNTER — Other Ambulatory Visit: Payer: Self-pay | Admitting: Family Medicine

## 2021-12-16 ENCOUNTER — Other Ambulatory Visit: Payer: Self-pay | Admitting: Physician Assistant

## 2021-12-16 DIAGNOSIS — M7541 Impingement syndrome of right shoulder: Secondary | ICD-10-CM

## 2021-12-16 DIAGNOSIS — N529 Male erectile dysfunction, unspecified: Secondary | ICD-10-CM

## 2022-01-04 ENCOUNTER — Other Ambulatory Visit: Payer: Self-pay | Admitting: Physician Assistant

## 2022-01-04 DIAGNOSIS — M7541 Impingement syndrome of right shoulder: Secondary | ICD-10-CM

## 2022-01-05 NOTE — Telephone Encounter (Signed)
Patient stated he has changed his PCP and will no longer need med refills or appts. ?

## 2022-01-09 ENCOUNTER — Encounter: Payer: Self-pay | Admitting: Gastroenterology

## 2022-01-31 ENCOUNTER — Other Ambulatory Visit: Payer: Self-pay | Admitting: Physician Assistant

## 2022-01-31 DIAGNOSIS — M7541 Impingement syndrome of right shoulder: Secondary | ICD-10-CM

## 2022-02-13 ENCOUNTER — Encounter: Payer: Self-pay | Admitting: Gastroenterology

## 2022-02-23 ENCOUNTER — Ambulatory Visit (AMBULATORY_SURGERY_CENTER): Payer: Self-pay | Admitting: *Deleted

## 2022-02-23 VITALS — Ht 72.0 in | Wt 213.0 lb

## 2022-02-23 DIAGNOSIS — Z86010 Personal history of colonic polyps: Secondary | ICD-10-CM

## 2022-02-23 MED ORDER — NA SULFATE-K SULFATE-MG SULF 17.5-3.13-1.6 GM/177ML PO SOLN: 1.0000 | Freq: Once | ORAL | 0 refills | Status: AC

## 2022-02-23 NOTE — Progress Notes (Signed)
No egg or soy allergy known to patient  No issues known to pt with past sedation with any surgeries or procedures Patient denies ever being told they had issues or difficulty with intubation  No FH of Malignant Hyperthermia Pt is not on diet pills Pt is not on  home 02  Pt is not on blood thinners  Pt denies issues with constipation  No A fib or A flutter Have any cardiac testing pending--  SUPREP Coupon to pt in PV today , Code to Pharmacy and  NO PA's for preps discussed with pt In PV today  Discussed with pt there will be an out-of-pocket cost for prep and that varies from $0 to 70 +  dollars - pt verbalized understanding  Pt instructed to use Singlecare.com or GoodRx for a price reduction on prep

## 2022-03-06 ENCOUNTER — Encounter: Payer: Self-pay | Admitting: Gastroenterology

## 2022-03-09 ENCOUNTER — Encounter: Payer: Self-pay | Admitting: Certified Registered Nurse Anesthetist

## 2022-03-11 ENCOUNTER — Other Ambulatory Visit: Payer: Self-pay | Admitting: Physician Assistant

## 2022-03-11 DIAGNOSIS — M7541 Impingement syndrome of right shoulder: Secondary | ICD-10-CM

## 2022-03-16 ENCOUNTER — Ambulatory Visit (AMBULATORY_SURGERY_CENTER): Payer: BC Managed Care – PPO | Admitting: Gastroenterology

## 2022-03-16 ENCOUNTER — Encounter: Payer: Self-pay | Admitting: Gastroenterology

## 2022-03-16 VITALS — BP 125/88 | HR 58 | Temp 98.6°F | Resp 17 | Ht 72.0 in | Wt 216.0 lb

## 2022-03-16 DIAGNOSIS — K635 Polyp of colon: Secondary | ICD-10-CM | POA: Diagnosis not present

## 2022-03-16 DIAGNOSIS — Z09 Encounter for follow-up examination after completed treatment for conditions other than malignant neoplasm: Secondary | ICD-10-CM | POA: Diagnosis present

## 2022-03-16 DIAGNOSIS — K6389 Other specified diseases of intestine: Secondary | ICD-10-CM | POA: Diagnosis not present

## 2022-03-16 DIAGNOSIS — Z86010 Personal history of colonic polyps: Secondary | ICD-10-CM

## 2022-03-16 DIAGNOSIS — D124 Benign neoplasm of descending colon: Secondary | ICD-10-CM

## 2022-03-16 MED ORDER — SODIUM CHLORIDE 0.9 % IV SOLN: 500.0000 mL | Freq: Once | INTRAVENOUS | Status: DC

## 2022-03-16 NOTE — Patient Instructions (Signed)
Handout given for polyps.  YOU HAD AN ENDOSCOPIC PROCEDURE TODAY AT [facility]:   Refer to the procedure report that was given to you for any specific questions about what was found during the examination.  If the procedure report does not answer your questions, please call your gastroenterologist to clarify.  If you requested that your care partner not be given the details of your procedure findings, then the procedure report has been included in a sealed envelope for you to review at your convenience later.  YOU SHOULD EXPECT: Some feelings of bloating in the abdomen. Passage of more gas than usual.  Walking can help get rid of the air that was put into your GI tract during the procedure and reduce the bloating. If you had a lower endoscopy (such as a colonoscopy or flexible sigmoidoscopy) you may notice spotting of blood in your stool or on the toilet paper. If you underwent a bowel prep for your procedure, you may not have a normal bowel movement for a few days.  Please Note:  You might notice some irritation and congestion in your nose or some drainage.  This is from the oxygen used during your procedure.  There is no need for concern and it should clear up in a day or so.  SYMPTOMS TO REPORT IMMEDIATELY:  Following lower endoscopy (colonoscopy or flexible sigmoidoscopy):  Excessive amounts of blood in the stool  Significant tenderness or worsening of abdominal pains  Swelling of the abdomen that is new, acute  Fever of 100F or higher  For urgent or emergent issues, a gastroenterologist can be reached at any hour by calling [phone]. Do not use MyChart messaging for urgent concerns.    DIET:  We do recommend a small meal at first, but then you may proceed to your regular diet.  Drink plenty of fluids but you should avoid alcoholic beverages for 24 hours.  ACTIVITY:  You should plan to take it easy for the rest of today and you should NOT DRIVE or use heavy  machinery until tomorrow (because of the sedation medicines used during the test).    FOLLOW UP: Our staff will call the number listed on your records the next business day following your procedure.  We will call around 7:15- 8:00 am to check on you and address any questions or concerns that you may have regarding the information given to you following your procedure. If we do not reach you, we will leave a message.  If you develop any symptoms (ie: fever, flu-like symptoms, shortness of breath, cough etc.) before then, please call [phone].  If you test positive for Covid 19 in the 2 weeks post procedure, please call and report this information to [place].    If any biopsies were taken you will be contacted by phone or by letter within the next 1-3 weeks.  Please call us at [phone] if you have not heard about the biopsies in 3 weeks.    SIGNATURES/CONFIDENTIALITY: You and/or your care partner have signed paperwork which will be entered into your electronic medical record.  These signatures attest to the fact that that the information above on your After Visit Summary has been reviewed and is understood.  Full responsibility of the confidentiality of this discharge information lies with you and/or your care-partner.

## 2022-03-16 NOTE — Progress Notes (Signed)
Report given to PACU, vss 

## 2022-03-16 NOTE — Op Note (Signed)
[facility] Patient Name: Raymond Craig Procedure Date: 03/16/2022 1:49 PM MRN: [id number] Endoscopist: Milus Banister , MD Age: 61 Referring MD:  Date of Birth: Mar 29, 1961 Gender: Male Account #: [id number] Procedure:                Colonoscopy Indications:              High risk colon cancer surveillance: Personal                            history of colonic polyps; Colonoscopy 2014 three                            subCM polyps (1 adenoma, 2 SSAs). Colonoscopy                            01/2017 five subCM polyps, one was a TA and the rest                            were HPs Medicines:                Monitored Anesthesia Care Procedure:                Pre-Anesthesia Assessment:                           - Prior to the procedure, a History and Physical                            was performed, and patient medications and                            allergies were reviewed. The patient's tolerance of                            previous anesthesia was also reviewed. The risks                            and benefits of the procedure and the sedation                            options and risks were discussed with the patient.                            All questions were answered, and informed consent                            was obtained. Prior Anticoagulants: The patient has                            taken no previous anticoagulant or antiplatelet                            agents. ASA Grade Assessment: II - A patient with  mild systemic disease. After reviewing the risks                            and benefits, the patient was deemed in                            satisfactory condition to undergo the procedure.                           After obtaining informed consent, the colonoscope                            was passed under direct vision. Throughout the                            procedure, the patient's blood pressure, pulse, and                             oxygen saturations were monitored continuously. The                            Olympus Scope [id number] was introduced through the                            anus and advanced to the the cecum, identified by                            appendiceal orifice and ileocecal valve. The                            colonoscopy was performed without difficulty. The                            patient tolerated the procedure well. The quality                            of the bowel preparation was good. The ileocecal                            valve, appendiceal orifice, and rectum were                            photographed. Scope In: 1:51:11 PM Scope Out: 1:59:12 PM Scope Withdrawal Time: 0 hours 5 minutes 40 seconds  Total Procedure Duration: 0 hours 8 minutes 1 second  Findings:                 A 5 mm polyp was found in the descending colon. The                            polyp was sessile. The polyp was removed with a                            cold snare. Resection and retrieval were complete.  Internal hemorrhoids were found. The hemorrhoids                            were small.                           The exam was otherwise without abnormality on                            direct and retroflexion views. Complications:            No immediate complications. Estimated blood loss:                            None. Estimated Blood Loss:     Estimated blood loss: none. Impression:               - One 5 mm polyp in the descending colon, removed                            with a cold snare. Resected and retrieved.                           - Internal hemorrhoids.                           - The examination was otherwise normal on direct                            and retroflexion views. Recommendation:           - Patient has a contact number available for                            emergencies. The signs and symptoms of potential                             delayed complications were discussed with the                            patient. Return to normal activities tomorrow.                            Written discharge instructions were provided to the                            patient.                           - Resume previous diet.                           - Continue present medications.                           - Await pathology results. Milus Banister, MD 03/16/2022 2:01:55 PM This report has been signed electronically.

## 2022-03-16 NOTE — Progress Notes (Signed)
Called to room to assist during endoscopic procedure.  Patient ID and intended procedure confirmed with present staff. Received instructions for my participation in the procedure from the performing physician.  

## 2022-03-16 NOTE — Progress Notes (Signed)
Colonoscopy 2014 three subCM polyps (1 adenoma, 2 SSAs). Colonoscopy 01/2017 five subCM polyps, one was a TA and the rest were HPs   HPI: This is a man with h/o polyps   ROS: complete GI ROS as described in HPI, all other review negative.  Constitutional:  No unintentional weight loss   Past Medical History:  Diagnosis Date   BPH (benign prostatic hypertrophy) with urinary retention 04/18/2013   Erectile dysfunction 04/18/2013   Family history of pancreatic cancer 04/18/2013   Hyperlipidemia 04/18/2013   Prostatitis     Past Surgical History:  Procedure Laterality Date   ANKLE SURGERY Right    tendon x2   COLONOSCOPY     lipoma removal  2014   left chest   POLYPECTOMY      Current Outpatient Medications  Medication Sig Dispense Refill   acetaminophen (TYLENOL) 500 MG tablet Take by mouth as needed.     atorvastatin (LIPITOR) 20 MG tablet Take 1 tablet (20 mg total) by mouth daily. 90 tablet 3   MELATONIN PO Take by mouth as needed.     OLANZapine (ZYPREXA) 2.5 MG tablet Take by mouth.     meloxicam (MOBIC) 15 MG tablet TAKE 1 TABLET (15 MG TOTAL) BY MOUTH DAILY. MUST KEEP UPCOMING APPT FOR FUTURE REFILLS. 30 tablet 0   Tadalafil 2.5 MG TABS Take 1 tablet (2.5 mg total) by mouth daily. 30 tablet 1   Current Facility-Administered Medications  Medication Dose Route Frequency Provider Last Rate Last Admin   0.9 %  sodium chloride infusion  500 mL Intravenous Once Milus Banister, MD        Allergies as of 03/16/2022   (No Known Allergies)    Family History  Problem Relation Age of Onset   Breast cancer Mother    Pancreatic cancer Father    Pancreatic cancer Paternal Uncle    Colon cancer Neg Hx    Colon polyps Neg Hx    Esophageal cancer Neg Hx    Rectal cancer Neg Hx    Stomach cancer Neg Hx    Crohn's disease Neg Hx     Social History   Socioeconomic History   Marital status: Married    Spouse name: Not on file   Number of children: Not on file   Years of  education: Not on file   Highest education level: Not on file  Occupational History   Not on file  Tobacco Use   Smoking status: Never    Passive exposure: Never   Smokeless tobacco: Never  Vaping Use   Vaping Use: Never used  Substance and Sexual Activity   Alcohol use: Yes    Alcohol/week: 4.0 standard drinks of alcohol    Types: 4 Cans of beer per week    Comment: rarely   Drug use: No   Sexual activity: Not on file  Other Topics Concern   Not on file  Social History Narrative   Not on file   Social Determinants of Health   Financial Resource Strain: Not on file  Food Insecurity: Not on file  Transportation Needs: Not on file  Physical Activity: Not on file  Stress: Not on file  Social Connections: Not on file  Intimate Partner Violence: Not on file     Physical Exam: BP (!) 154/89   Pulse 63   Temp 98.6 F (37 C)   Ht 6' (1.829 m)   Wt 216 lb (98 kg)   SpO2 97%  BMI 29.29 kg/m  Constitutional: generally well-appearing Psychiatric: alert and oriented x3 Lungs: CTA bilaterally Heart: no MCR  Assessment and plan: 61 y.o. male with h/o polyps  Surveillance colonoscopy today  Care is appropriate for the ambulatory setting.  Owens Loffler, MD [facility] 03/16/2022, 1:11 PM

## 2022-03-17 ENCOUNTER — Telehealth: Payer: Self-pay | Admitting: *Deleted

## 2022-03-17 NOTE — Telephone Encounter (Signed)
  Follow up Call-     03/16/2022    1:01 PM  Call back number  Post procedure Call Back phone  # [phone]  Permission to leave phone message Yes     Patient questions:  Do you have a fever, pain , or abdominal swelling? No. Pain Score  0 *  Have you tolerated food without any problems? Yes.    Have you been able to return to your normal activities? Yes.    Do you have any questions about your discharge instructions: Diet   No. Medications  No. Follow up visit  No.  Do you have questions or concerns about your Care? No.  Actions: * If pain score is 4 or above: No action needed, pain <4.

## 2022-03-20 ENCOUNTER — Encounter: Payer: Self-pay | Admitting: Gastroenterology

## 2022-08-11 IMAGING — DX DG SHOULDER 2+V*R*
3 series · 3 of 3 positions shown · non-contrast
Comparison: None.

CLINICAL DATA: Right shoulder pain x1 month.

EXAM:
RIGHT SHOULDER - 2+ VIEW

[shoulder grashey]
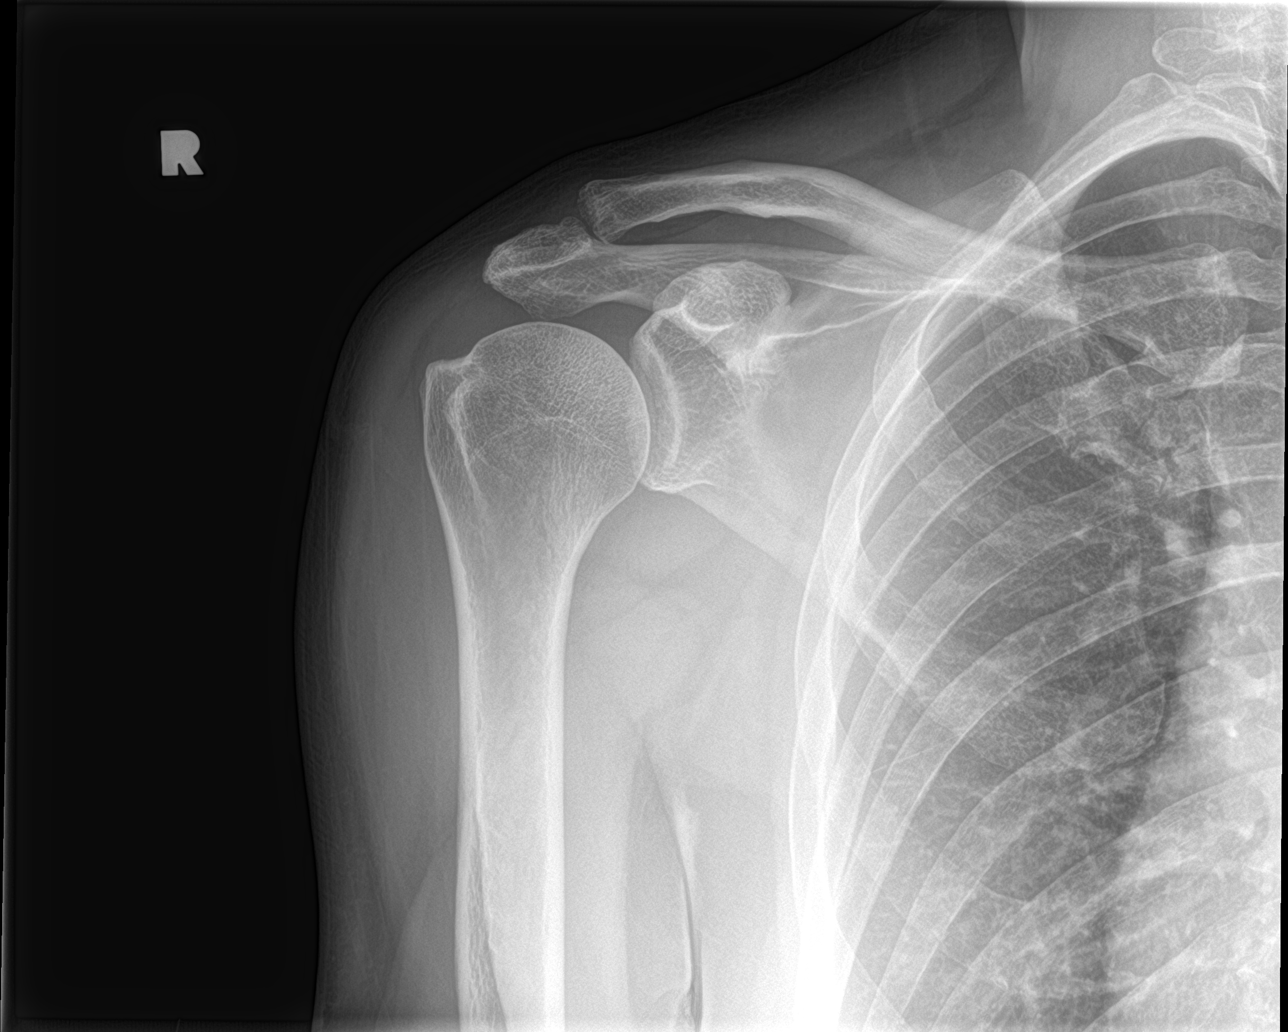

[shoulder y view]
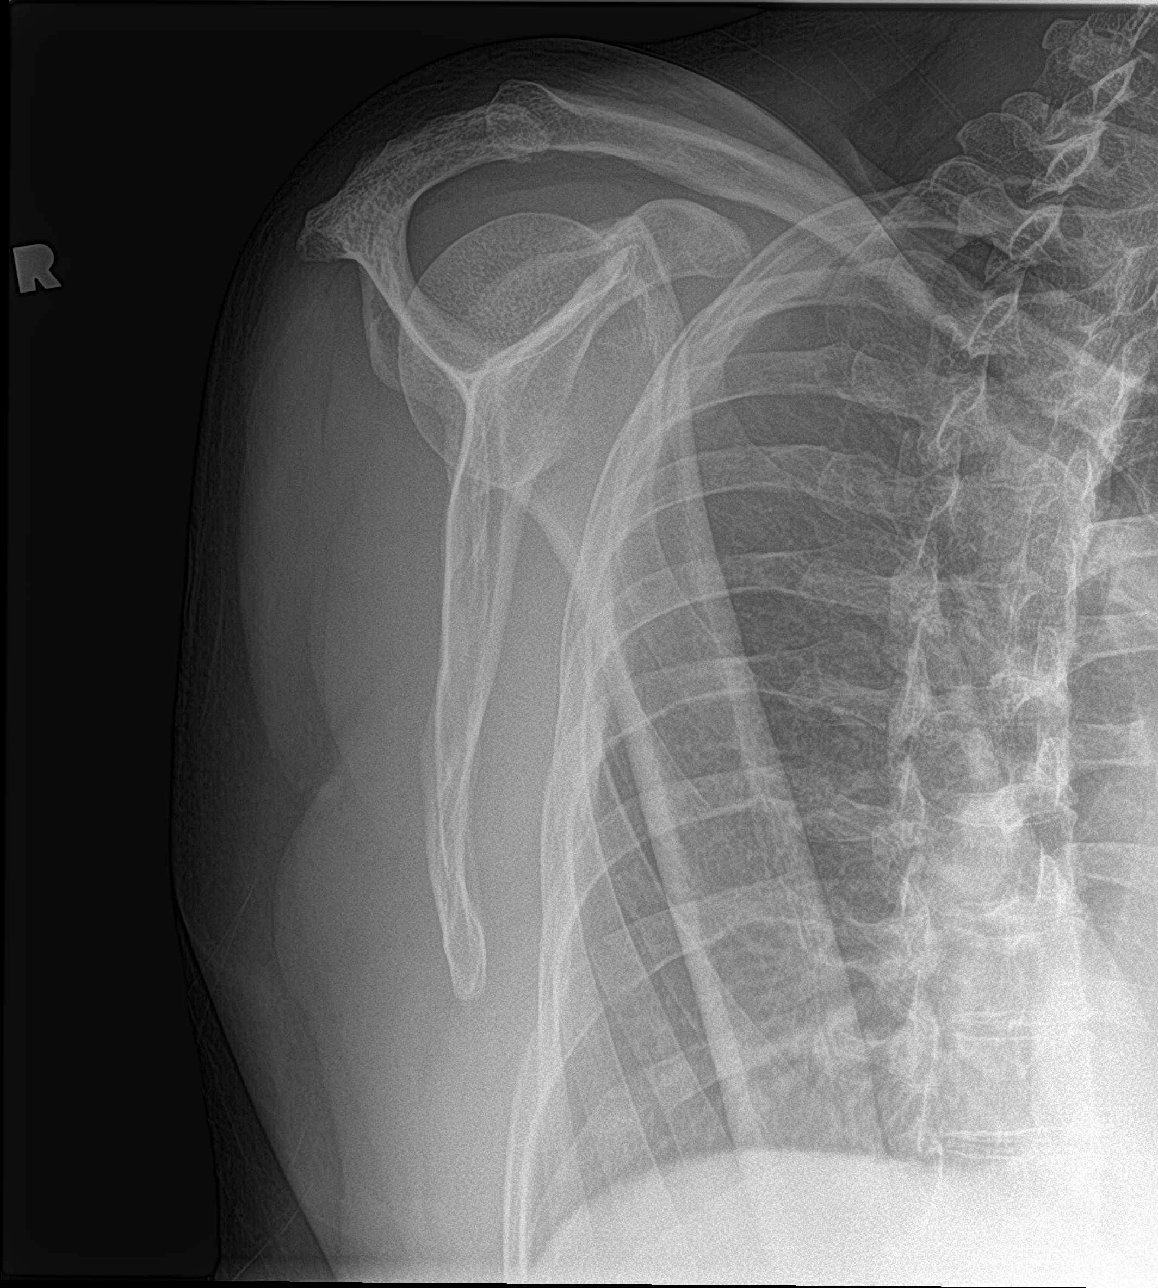

[shoulder axillary]
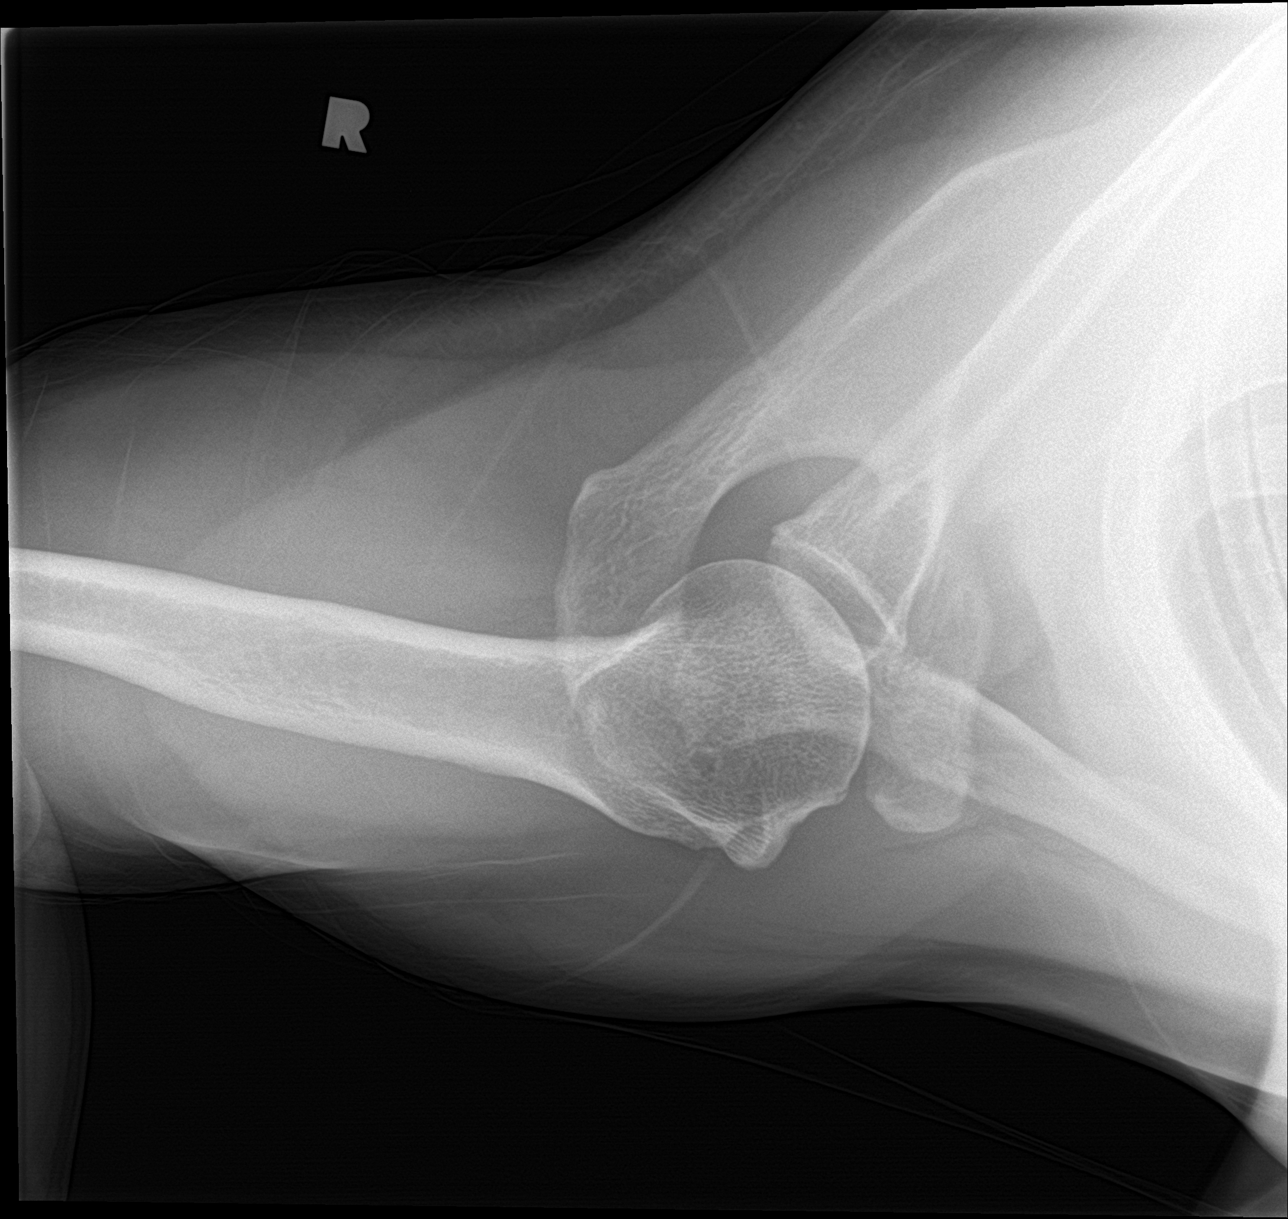

[3 of 3 positions shown; findings below may reference images not displayed]

FINDINGS: There is no evidence of fracture or dislocation. Mild
acromioclavicular degenerative change. Soft tissues are
unremarkable. Visualized lung fields are clear.
IMPRESSION: No acute finding. Mild acromioclavicular degenerative change.

## 2022-09-22 IMAGING — DX DG ANKLE COMPLETE 3+V*R*
3 series · 3 of 3 positions shown · non-contrast
Comparison: Right ankle x-ray 12/01/2017.

CLINICAL DATA: Lateral ankle pain.

EXAM:
RIGHT ANKLE - COMPLETE 3+ VIEW

[ankle ap]
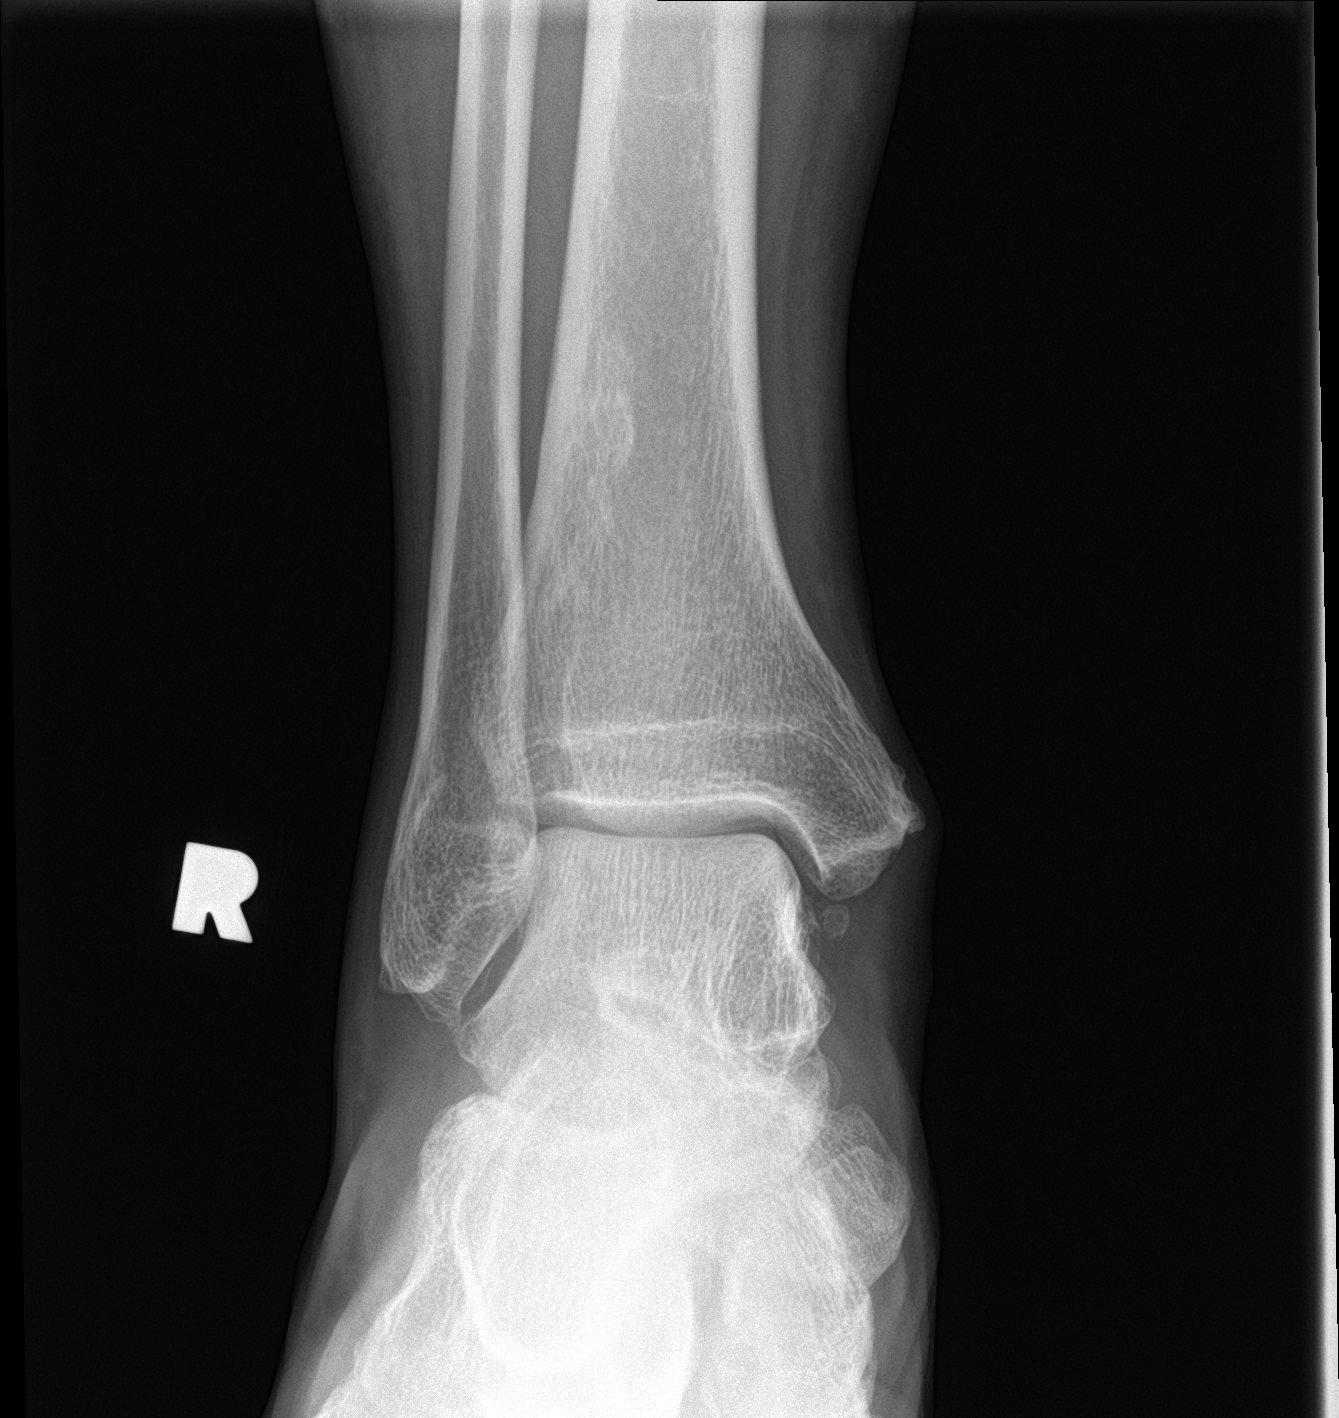

[ankle obl]
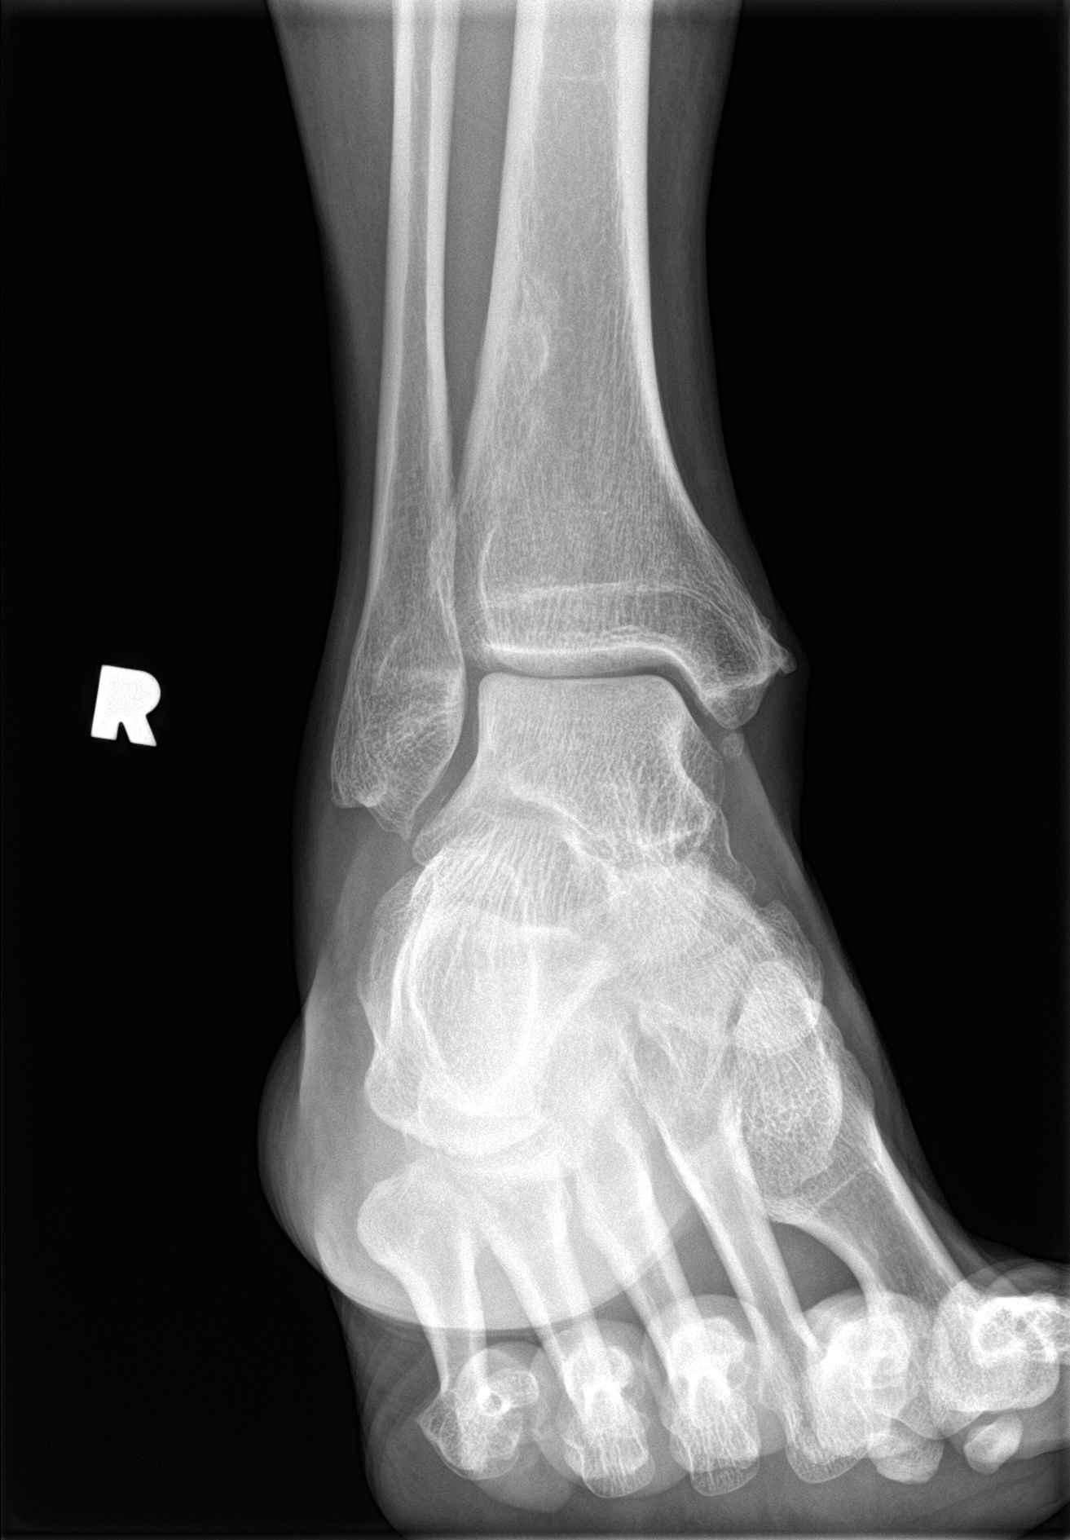

[ankle lat]
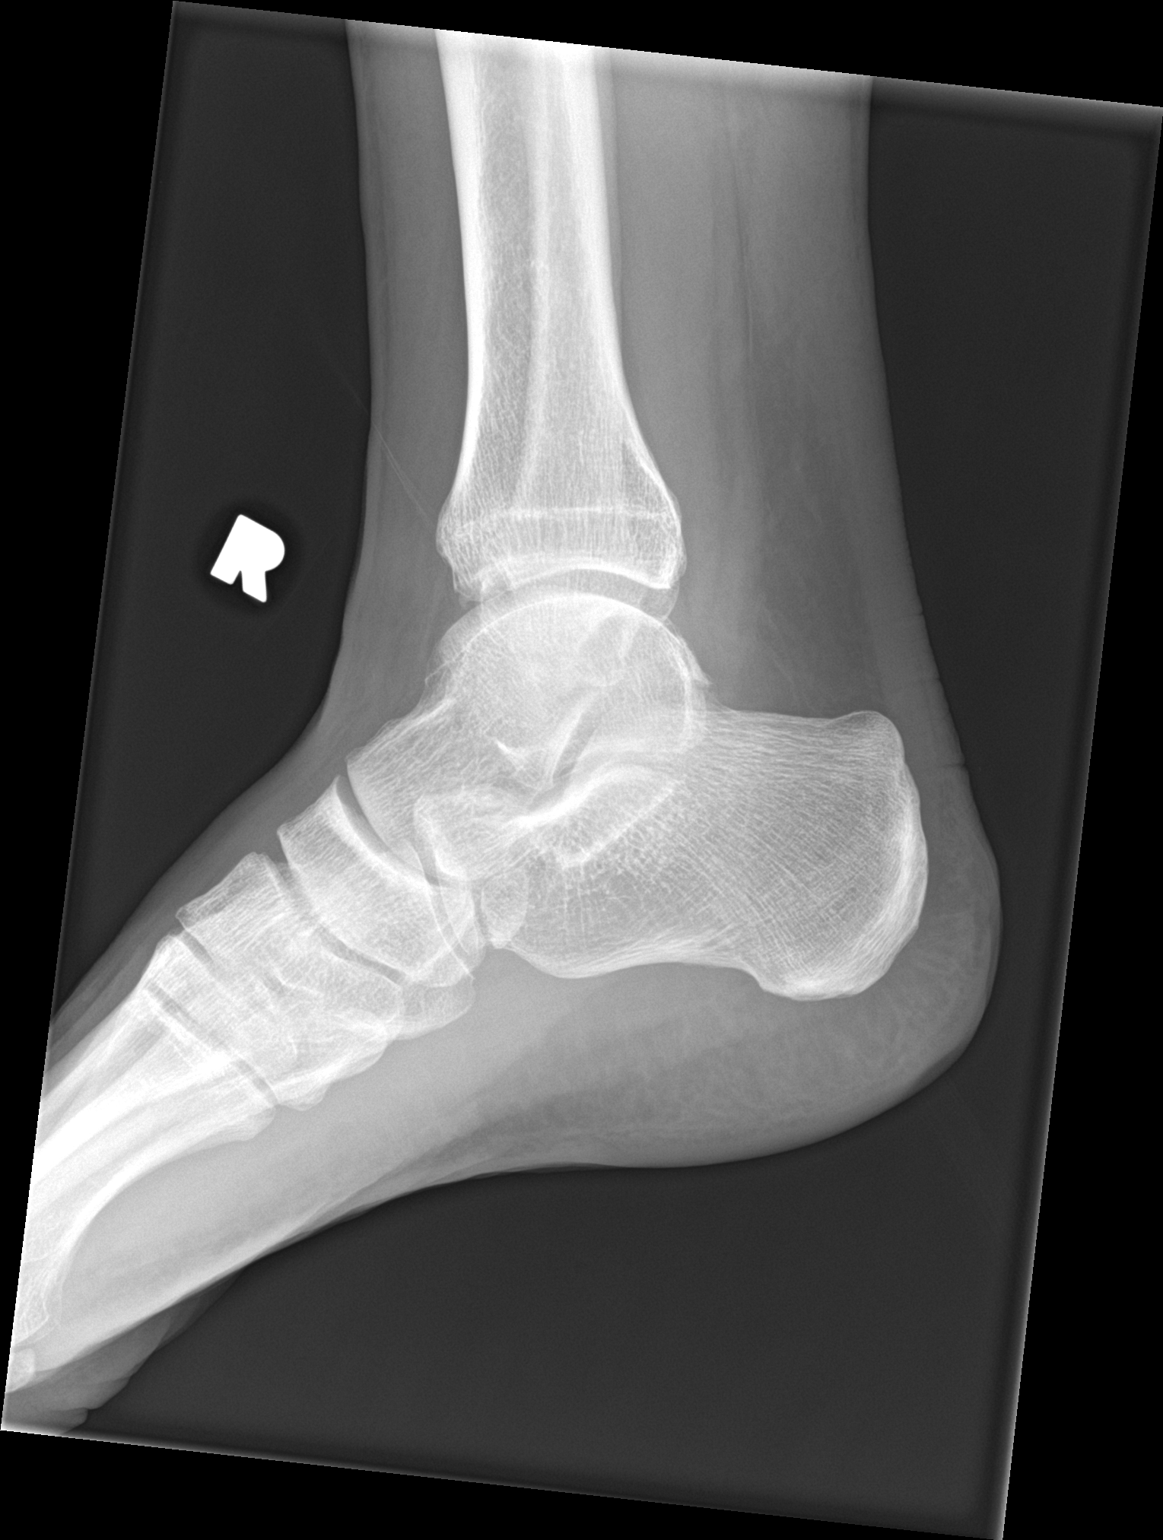

[3 of 3 positions shown; findings below may reference images not displayed]

FINDINGS: There is no evidence for acute fracture or dislocation. Joint spaces
are maintained. Again seen is a small well corticated rounded
density adjacent to the tip of the medial malleolus which may
represent accessory ossification center. Soft tissues are
unremarkable.
IMPRESSION: No fracture or or ankle joint abnormality.

## 2023-10-15 ENCOUNTER — Encounter: Payer: Self-pay | Admitting: Sports Medicine

## 2023-10-15 ENCOUNTER — Ambulatory Visit (INDEPENDENT_AMBULATORY_CARE_PROVIDER_SITE_OTHER): Payer: 59 | Admitting: Sports Medicine

## 2023-10-15 ENCOUNTER — Ambulatory Visit: Payer: 59

## 2023-10-15 DIAGNOSIS — M7541 Impingement syndrome of right shoulder: Secondary | ICD-10-CM

## 2023-10-15 DIAGNOSIS — M19011 Primary osteoarthritis, right shoulder: Secondary | ICD-10-CM

## 2023-10-15 NOTE — Assessment & Plan Note (Signed)
This is a very pleasant 63 year old male, we saw him back in 2022 with chronic right shoulder pain, impingement signs on exam, he did really well with physical therapy. He has slacked off of some of his exercises over the last several years and is having a recurrence of pain over the deltoid worse with abduction. He has positive Neer's, Hawkins, empty can signs, other shoulder structure testing is normal. We will start conservative again, x-rays, home PT, return in 6 weeks, injection if not better.

## 2023-10-15 NOTE — Progress Notes (Signed)
    Procedures performed today:    None.  Independent interpretation of notes and tests performed by another provider:   None.  Brief History, Exam, Impression, and Recommendations:    Impingement syndrome, shoulder, right This is a very pleasant 64 year old male, we saw him back in 2022 with chronic right shoulder pain, impingement signs on exam, he did really well with physical therapy. He has slacked off of some of his exercises over the last several years and is having a recurrence of pain over the deltoid worse with abduction. He has positive Neer's, Hawkins, empty can signs, other shoulder structure testing is normal. We will start conservative again, x-rays, home PT, return in 6 weeks, injection if not better.    ____________________________________________ Ihor Austin. Benjamin Stain, M.D., ABFM., CAQSM., AME. Primary Care and Sports Medicine [facility]  Adjunct Professor of Family Medicine  [employer] [facility] [employer]  Restaurant manager, fast food

## 2023-12-02 ENCOUNTER — Ambulatory Visit: Payer: 59 | Admitting: Sports Medicine

## 2023-12-02 ENCOUNTER — Other Ambulatory Visit (INDEPENDENT_AMBULATORY_CARE_PROVIDER_SITE_OTHER)

## 2023-12-02 ENCOUNTER — Encounter: Payer: Self-pay | Admitting: Sports Medicine

## 2023-12-02 DIAGNOSIS — M7541 Impingement syndrome of right shoulder: Secondary | ICD-10-CM

## 2023-12-02 MED ORDER — TRIAMCINOLONE ACETONIDE 40 MG/ML IJ SUSP
40.0000 mg | Freq: Once | INTRAMUSCULAR | Status: AC
Start: 2023-12-02 — End: 2023-12-02
  Administered 2023-12-02: 40 mg via INTRAMUSCULAR

## 2023-12-02 NOTE — Assessment & Plan Note (Signed)
 This is a very pleasant 63 year old male, we last saw him in 2022 with chronic right shoulder pain, he had some impingement signs, he did well with physical therapy. Over the last year or so he had slacked off of his therapy and had a recurrence of pain over the deltoid worse with abduction, at the last visit he had positive Neer's, Hawkins, empty can signs, after failure of conservative treatment for the last 6 weeks we have proceeded again with a subacromial injection, rotator cuff was intact, he will restart his home physical therapy next week and return to see me in 6 weeks.

## 2023-12-02 NOTE — Addendum Note (Signed)
 Addended by: Samule Dry on: 12/02/2023 02:55 PM   Modules accepted: Orders

## 2023-12-02 NOTE — Progress Notes (Signed)
    Procedures performed today:    Procedure: Real-time Ultrasound Guided injection of the right subacromial bursa Device: Samsung HS60  Verbal informed consent obtained.  Time-out conducted.  Noted no overlying erythema, induration, or other signs of local infection.  Skin prepped in a sterile fashion.  Local anesthesia: Topical Ethyl chloride.  With sterile technique and under real time ultrasound guidance: Noted intact rotator cuff, 1 cc Kenalog 40, 1 cc lidocaine, 1 cc bupivacaine injected easily Completed without difficulty  Advised to call if fevers/chills, erythema, induration, drainage, or persistent bleeding.  Images permanently stored and available for review in PACS.  Impression: Technically successful ultrasound guided injection.  Independent interpretation of notes and tests performed by another provider:   None.  Brief History, Exam, Impression, and Recommendations:    Impingement syndrome, shoulder, right This is a very pleasant 63 year old male, we last saw him in 2022 with chronic right shoulder pain, he had some impingement signs, he did well with physical therapy. Over the last year or so he had slacked off of his therapy and had a recurrence of pain over the deltoid worse with abduction, at the last visit he had positive Neer's, Hawkins, empty can signs, after failure of conservative treatment for the last 6 weeks we have proceeded again with a subacromial injection, rotator cuff was intact, he will restart his home physical therapy next week and return to see me in 6 weeks.    ____________________________________________ Ihor Austin. Benjamin Stain, M.D., ABFM., CAQSM., AME. Primary Care and Sports Medicine [facility]  Adjunct Professor of Family Medicine  [employer]  Restaurant manager, fast food

## 2024-01-05 ENCOUNTER — Ambulatory Visit: Payer: Self-pay

## 2024-01-05 ENCOUNTER — Telehealth: Payer: Self-pay

## 2024-01-05 NOTE — Telephone Encounter (Signed)
 Patient is scheduled to see Dr. Elva Hamburger on 01/13/24. Can we contact the patient and see if they would like a sooner appointment. Thanks in advance.

## 2024-01-05 NOTE — Telephone Encounter (Signed)
  Chief Complaint: knee injury Symptoms: pain Frequency: constant Pertinent Negatives: Patient denies open skin, deformity Disposition: [x] ED /[x] Urgent Care (no appt availability in office) / [] Appointment(In office/virtual)/ []  [place] [facility]/ [] Home Care/ [] Refused Recommended Disposition /[] [place] [facility]/ []  Follow-up with PCP Additional Notes:  Left knee injury. Doing leg extensions with weights when he felt a pop in left knee. Immediate severe pain, swelling and unable to bear weight. Emergency evaluation advised. Plans to seek evaluation at urgent care   Copied from CRM [id number]. Topic: Clinical - Red Word Triage >> Jan 05, 2024 10:04 AM Arlie Benedict B wrote: [facility] that prompted transfer to Nurse Triage: patient stated that he stepped down on left knee on the side where the side where tendons are extensive pain and swelling at this time Reason for Disposition  Can't stand (bear weight) or walk  Protocols used: Knee Injury-A-AH

## 2024-01-05 NOTE — Telephone Encounter (Signed)
 Copied from CRM [id number]. Topic: Appointments - Scheduling Inquiry for Clinic >> Jan 05, 2024  7:38 AM Arlie Benedict B wrote: Reason for CRM: Patient called in to request an appt with Dr. Elva Hamburger for a recent knee injury. Patient stated he popped his knee on yesterday, and mobility Is extremely complicated at this time. [phone]

## 2024-01-13 ENCOUNTER — Other Ambulatory Visit (INDEPENDENT_AMBULATORY_CARE_PROVIDER_SITE_OTHER)

## 2024-01-13 ENCOUNTER — Encounter: Payer: Self-pay | Admitting: Sports Medicine

## 2024-01-13 ENCOUNTER — Ambulatory Visit (INDEPENDENT_AMBULATORY_CARE_PROVIDER_SITE_OTHER): Admitting: Sports Medicine

## 2024-01-13 DIAGNOSIS — M25562 Pain in left knee: Secondary | ICD-10-CM

## 2024-01-13 DIAGNOSIS — S83207A Unspecified tear of unspecified meniscus, current injury, left knee, initial encounter: Secondary | ICD-10-CM | POA: Insufficient documentation

## 2024-01-13 MED ORDER — TRIAMCINOLONE ACETONIDE 40 MG/ML IJ SUSP
40.0000 mg | Freq: Once | INTRAMUSCULAR | Status: AC
  Administered 2024-01-13: 40 mg via INTRA_ARTICULAR

## 2024-01-13 NOTE — Assessment & Plan Note (Signed)
 Very pleasant 63 year old male, exercising, felt a pop laterally proceeded by some pain. Went to urgent care, x-rays did show osteoarthritis. On exam does have pain posterior/lateral joint line with terminal flexion, positive McMurray's sign. Ligament structures are stable, no effusion. Per request we did an injection today, home conditioning given for now. Wear knee brace. Return to see me in about 6 weeks. If not better we will consider MRI.

## 2024-01-13 NOTE — Addendum Note (Signed)
 Addended by: Montgomery Apgar on: 01/13/2024 10:19 AM   Modules accepted: Orders

## 2024-01-13 NOTE — Progress Notes (Signed)
    Procedures performed today:    Procedure: Real-time Ultrasound Guided injection of the left knee Device: Samsung HS60  Verbal informed consent obtained.  Time-out conducted.  Noted no overlying erythema, induration, or other signs of local infection.  Skin prepped in a sterile fashion.  Local anesthesia: Topical Ethyl chloride.  With sterile technique and under real time ultrasound guidance: Trace effusion noted, 1 cc Kenalog  40, 2 cc lidocaine , 2 cc bupivacaine injected easily Completed without difficulty  Advised to call if fevers/chills, erythema, induration, drainage, or persistent bleeding.  Images permanently stored and available for review in PACS.  Impression: Technically successful ultrasound guided injection.  Independent interpretation of notes and tests performed by another provider:   None.  Brief History, Exam, Impression, and Recommendations:    Pain in joint of left knee Very pleasant 63 year old male, exercising, felt a pop laterally proceeded by some pain. Went to urgent care, x-rays did show osteoarthritis. On exam does have pain posterior/lateral joint line with terminal flexion, positive McMurray's sign. Ligament structures are stable, no effusion. Per request we did an injection today, home conditioning given for now. Wear knee brace. Return to see me in about 6 weeks. If not better we will consider MRI.    ____________________________________________ Joselyn Nicely. Sandy Crumb, M.D., ABFM., CAQSM., AME. Primary Care and Sports Medicine [facility] [facility]  Adjunct Professor of [facility] Medicine  [employer]  Restaurant manager, fast food

## 2024-02-24 ENCOUNTER — Ambulatory Visit (INDEPENDENT_AMBULATORY_CARE_PROVIDER_SITE_OTHER): Admitting: Sports Medicine

## 2024-02-24 DIAGNOSIS — S83207A Unspecified tear of unspecified meniscus, current injury, left knee, initial encounter: Secondary | ICD-10-CM

## 2024-02-24 DIAGNOSIS — M25562 Pain in left knee: Secondary | ICD-10-CM | POA: Diagnosis not present

## 2024-02-24 MED ORDER — TRAMADOL HCL 50 MG PO TABS: 50.0000 mg | ORAL_TABLET | Freq: Three times a day (TID) | ORAL | 0 refills | Status: AC | PRN

## 2024-02-24 MED ORDER — MELOXICAM 15 MG PO TABS: ORAL_TABLET | ORAL | 3 refills | Status: AC

## 2024-02-24 NOTE — Progress Notes (Addendum)
    Procedures performed today:    None.  Independent interpretation of notes and tests performed by another provider:   None.  Brief History, Exam, Impression, and Recommendations:    Medial meniscal tear of left knee This very pleasant 62 year old male returns, recall back in May he was exercising, felt a pop laterally postceded by some pain. He went to urgent care and got some x-rays that did show a bit of osteoarthritis, on his exam with me he had some posterior lateral joint line pain worse with terminal flexion, he had a positive McMurray's sign, we did an injection to try to give him some relief but unfortunately he returns today with persistent pain, laterally, medially, persistent mechanical symptoms per He does have an effusion today, concern for meniscal tearing, adding an MRI, we will switch him from ibuprofen to meloxicam , add tramadol  for breakthrough pain. If we do see large meniscal tear he will need arthroscopic intervention, he prefers a [facility], if only arthritis is seen we will proceed with viscosupplementation.  Update: MRI does confirm medial meniscal tear as well as some reactive tibial edema potential subchondral insufficiency fracture, he will minimize weightbearing and we will get him referred to [facility] orthopedic surgery.    ____________________________________________ Debby PARAS. Curtis, M.D., ABFM., CAQSM., AME. Primary Care and Sports Medicine [place]  Adjunct Professor of [facility] [employer] Medicine  [employer]  Restaurant manager, fast food

## 2024-02-24 NOTE — Assessment & Plan Note (Addendum)
 This very pleasant 63 year old male returns, recall back in May he was exercising, felt a pop laterally postceded by some pain. He went to urgent care and got some x-rays that did show a bit of osteoarthritis, on his exam with me he had some posterior lateral joint line pain worse with terminal flexion, he had a positive McMurray's sign, we did an injection to try to give him some relief but unfortunately he returns today with persistent pain, laterally, medially, persistent mechanical symptoms per He does have an effusion today, concern for meniscal tearing, adding an MRI, we will switch him from ibuprofen to meloxicam , add tramadol  for breakthrough pain. If we do see large meniscal tear he will need arthroscopic intervention, he prefers a [facility], if only arthritis is seen we will proceed with viscosupplementation.  Update: MRI does confirm medial meniscal tear as well as some reactive tibial edema potential subchondral insufficiency fracture, he will minimize weightbearing and we will get him referred to [facility] orthopedic surgery.

## 2024-02-29 ENCOUNTER — Ambulatory Visit

## 2024-02-29 ENCOUNTER — Ambulatory Visit: Payer: Self-pay | Admitting: Sports Medicine

## 2024-02-29 DIAGNOSIS — M25562 Pain in left knee: Secondary | ICD-10-CM

## 2024-02-29 NOTE — Addendum Note (Signed)
 Addended by: CURTIS DEBBY PARAS on: 02/29/2024 02:59 PM   Modules accepted: Orders

## 2024-03-01 ENCOUNTER — Encounter: Payer: Self-pay | Admitting: Sports Medicine

## 2024-04-06 ENCOUNTER — Ambulatory Visit (INDEPENDENT_AMBULATORY_CARE_PROVIDER_SITE_OTHER): Admitting: Sports Medicine

## 2024-04-06 ENCOUNTER — Other Ambulatory Visit (INDEPENDENT_AMBULATORY_CARE_PROVIDER_SITE_OTHER)

## 2024-04-06 DIAGNOSIS — M7541 Impingement syndrome of right shoulder: Secondary | ICD-10-CM | POA: Diagnosis not present

## 2024-04-06 MED ORDER — TRIAMCINOLONE ACETONIDE 40 MG/ML IJ SUSP
40.0000 mg | Freq: Once | INTRAMUSCULAR | Status: AC
  Administered 2024-04-06: 40 mg via INTRA_ARTICULAR

## 2024-04-06 NOTE — Assessment & Plan Note (Signed)
 Very pleasant 63 year old male, he has known shoulder impingement syndrome, he does well with occasional injections, the last injection was in April of this year, now having recurrence of pain over the deltoid worse with abduction, positive impingement signs on exam per Subacromial injection today, he will get back to doing his home physical therapy and return to see me as needed.

## 2024-04-06 NOTE — Addendum Note (Signed)
 Addended by: OLEY CHIQUITA CROME on: 04/06/2024 11:52 AM   Modules accepted: Orders

## 2024-04-06 NOTE — Progress Notes (Signed)
    Procedures performed today:    Procedure: Real-time Ultrasound Guided injection of the right subacromial bursa Device: Samsung HS60  Verbal informed consent obtained.  Time-out conducted.  Noted no overlying erythema, induration, or other signs of local infection.  Skin prepped in a sterile fashion.  Local anesthesia: Topical Ethyl chloride.  With sterile technique and under real time ultrasound guidance: Noted intact rotator cuff, 1 cc kenalog  40, 1 cc lidocaine , 1 cc bupivacaine injected easily.   Advised to call if fevers/chills, erythema, induration, drainage, or persistent bleeding.  Images permanently stored and available for review in PACS.  Impression: Technically successful ultrasound guided injection.  Independent interpretation of notes and tests performed by another provider:   None.  Brief History, Exam, Impression, and Recommendations:    Impingement syndrome, shoulder, right Very pleasant 63 year old male, he has known shoulder impingement syndrome, he does well with occasional injections, the last injection was in April of this year, now having recurrence of pain over the deltoid worse with abduction, positive impingement signs on exam per Subacromial injection today, he will get back to doing his home physical therapy and return to see me as needed.    ____________________________________________ Debby PARAS. Curtis, M.D., ABFM., CAQSM., AME. Primary Care and Sports Medicine [facility]  Adjunct Professor of [facility] Medicine  [employer]  Restaurant manager, fast food

## 2024-04-25 ENCOUNTER — Encounter: Payer: Self-pay | Admitting: Sports Medicine
